# Patient Record
Sex: Male | Born: 1960
Health system: Southern US, Community
[De-identification: ages and names within clinical notes are randomized; demographics above are authoritative.]

## PROBLEM LIST (undated history)

## (undated) DIAGNOSIS — E785 Hyperlipidemia, unspecified: Secondary | ICD-10-CM

## (undated) DIAGNOSIS — E079 Disorder of thyroid, unspecified: Secondary | ICD-10-CM

## (undated) DIAGNOSIS — I10 Essential (primary) hypertension: Secondary | ICD-10-CM

## (undated) HISTORY — DX: Hyperlipidemia, unspecified: E78.5

## (undated) HISTORY — DX: Essential (primary) hypertension: I10

## (undated) HISTORY — DX: Disorder of thyroid, unspecified: E07.9

---

## 2015-04-21 ENCOUNTER — Other Ambulatory Visit: Payer: Self-pay | Admitting: Internal Medicine

## 2015-04-21 ENCOUNTER — Ambulatory Visit
Admission: RE | Admit: 2015-04-21 | Discharge: 2015-04-21 | Disposition: A | Discharge status: Home / Self Care | Payer: 59 | Source: Ambulatory Visit | Attending: Internal Medicine | Admitting: Internal Medicine

## 2015-04-21 DIAGNOSIS — R05 Cough: Secondary | ICD-10-CM

## 2015-04-21 DIAGNOSIS — R059 Cough, unspecified: Secondary | ICD-10-CM

## 2016-06-11 ENCOUNTER — Telehealth: Payer: Self-pay | Admitting: Oncology

## 2016-06-11 ENCOUNTER — Encounter: Payer: Self-pay | Admitting: Oncology

## 2016-06-11 NOTE — Telephone Encounter (Signed)
Appt scheduled w/Shadad for 11/15 at 2pm. Pt agreed to appt date and time. Demographics verified. Letter mailed and directions mailed to the patient.

## 2016-07-10 ENCOUNTER — Ambulatory Visit (HOSPITAL_BASED_OUTPATIENT_CLINIC_OR_DEPARTMENT_OTHER): Payer: 59

## 2016-07-10 ENCOUNTER — Ambulatory Visit (HOSPITAL_BASED_OUTPATIENT_CLINIC_OR_DEPARTMENT_OTHER): Payer: 59 | Admitting: Oncology

## 2016-07-10 ENCOUNTER — Telehealth: Payer: Self-pay | Admitting: Oncology

## 2016-07-10 VITALS — BP 159/82 | HR 84 | Temp 98.1°F | Resp 18 | Ht 68.0 in | Wt 142.9 lb

## 2016-07-10 DIAGNOSIS — D72829 Elevated white blood cell count, unspecified: Secondary | ICD-10-CM

## 2016-07-10 DIAGNOSIS — D729 Disorder of white blood cells, unspecified: Secondary | ICD-10-CM

## 2016-07-10 DIAGNOSIS — G2581 Restless legs syndrome: Secondary | ICD-10-CM | POA: Diagnosis not present

## 2016-07-10 DIAGNOSIS — Z720 Tobacco use: Secondary | ICD-10-CM

## 2016-07-10 LAB — COMPREHENSIVE METABOLIC PANEL
ALBUMIN: 3.9 g/dL (ref 3.5–5.0)
ALK PHOS: 143 U/L (ref 40–150)
ALT: 34 U/L (ref 0–55)
ANION GAP: 10 meq/L (ref 3–11)
AST: 28 U/L (ref 5–34)
BILIRUBIN TOTAL: 0.32 mg/dL (ref 0.20–1.20)
BUN: 21 mg/dL (ref 7.0–26.0)
CALCIUM: 9.7 mg/dL (ref 8.4–10.4)
CHLORIDE: 104 meq/L (ref 98–109)
CO2: 26 mEq/L (ref 22–29)
CREATININE: 0.8 mg/dL (ref 0.7–1.3)
EGFR: >90 mL/min/{1.73_m2} (ref >90)
Glucose: 83 mg/dl (ref 70–140)
Potassium: 4.3 mEq/L (ref 3.5–5.1)
Sodium: 139 mEq/L (ref 136–145)
TOTAL PROTEIN: 7.8 g/dL (ref 6.4–8.3)

## 2016-07-10 LAB — CBC WITH DIFFERENTIAL/PLATELET
BASO%: 0.6 % (ref 0.0–2.0)
Basophils Absolute: 0.1 10*3/uL (ref 0.0–0.1)
EOS ABS: 0.5 10*3/uL (ref 0.0–0.5)
EOS%: 4.4 % (ref 0.0–7.0)
HCT: 40.5 % (ref 38.4–49.9)
HEMOGLOBIN: 13.3 g/dL (ref 13.0–17.1)
LYMPH%: 32.4 % (ref 14.0–49.0)
MCH: 29.9 pg (ref 27.2–33.4)
MCHC: 32.8 g/dL (ref 32.0–36.0)
MCV: 91 fL (ref 79.3–98.0)
MONO#: 1 10*3/uL — AB (ref 0.1–0.9)
MONO%: 9 % (ref 0.0–14.0)
NEUT%: 53.6 % (ref 39.0–75.0)
NEUTROS ABS: 5.7 10*3/uL (ref 1.5–6.5)
PLATELETS: 243 10*3/uL (ref 140–400)
RBC: 4.45 10*6/uL (ref 4.20–5.82)
RDW: 13.3 % (ref 11.0–14.6)
WBC: 10.7 10*3/uL — AB (ref 4.0–10.3)
lymph#: 3.5 10*3/uL — ABNORMAL HIGH (ref 0.9–3.3)

## 2016-07-10 LAB — CHCC SMEAR

## 2016-07-10 NOTE — Progress Notes (Signed)
Reason for Referral:   HPI: 55 year old gentleman with past medical history significant for restless leg syndrome and hypothyroidism referred to me for evaluation of leukocytosis. He reports he has been dealing with a severe case of restless leg syndrome for over 10 years. He has been prescribed multiple medications in the past including narcotic analgesia and for the last 4 years he has been on methadone as well as Ritalin to help combat the side effects associated with methadone. He has been on Neurontin in the past but have been discontinued. He was also recently diagnosed with Bell's palsy which has resolved at this time. He was noted to have a leukocytosis on a CBC in October 2017. His white cell count was 15,000 with a hemoglobin of 12.4. His platelet count was normal at 314. His differential was normal with neutrophils, lymphocytes, monocytes and eosinophils are within normal range. His previous CBC in 2016 showed a white cell count of 13,000. His hemoglobin was 4.6. His otherwise CBC was normal. His CBC from August 2015 showed white cell count of 11.9 and normal hemoglobin, hematocrit, platelets and differential.  He is asymptomatic from this findings without any constitutional symptoms. He denied any fevers, chills or sweats or excessive fatigue. He does have history of heavy nicotine use used to be smoking traditionally and currently using electronic cigarettes and is highly and nicotine. Not any recent recurrent infections. He is up-to-date on 2 age-appropriate cancer screening. He did have a chest x-ray in August 2016 which was normal.  He does not report any headaches, blurry vision, syncope or seizures. He does not report any fevers, chills or sweats. He is not reporting any chest pain, palpitation, orthopnea or leg edema. He does not report any cough, wheezing or hemoptysis. He does not report any nausea, vomiting, abdominal pain or early satiety. He does not report any frequency urgency or  hesitancy. He does not report any skeletal complaints of arthralgias or myalgias. He does report chronic Pain related to his restless leg syndrome.    His past medical history significant for hypothyroidism, chronic pain syndrome related to restless leg. He denied any history of hypertension, diabetes or coronary disease. He denied any surgeries.   Current Outpatient Prescriptions:  .  Cholecalciferol (VITAMIN D-3) 1000 units CAPS, Take 1 capsule by mouth daily., Disp: , Rfl:  .  Coenzyme Q10 (COQ10) 100 MG CAPS, Take 1 capsule by mouth daily., Disp: , Rfl:  .  levothyroxine (SYNTHROID, LEVOTHROID) 88 MCG tablet, Take 1 tablet by mouth daily., Disp: , Rfl:  .  methadone (DOLOPHINE) 10 MG tablet, Take 30 mg by mouth 2 (two) times daily., Disp: , Rfl:  .  methylphenidate (RITALIN) 10 MG tablet, Take 1 tablet by mouth daily., Disp: , Rfl:  .  rOPINIRole (REQUIP) 3 MG tablet, Take 1 tablet by mouth daily., Disp: , Rfl:  .  traZODone (DESYREL) 50 MG tablet, Take 1 tablet by mouth at bedtime as needed., Disp: , Rfl:    Allergies  Allergen Reactions  . Aspirin Rash    Patient stated,"if I take aspirin in large doses I will get a rash."  :  No family history Of any malignancies or blood disorders.   Social History   Social History  . Marital status: Married    Spouse name: N/A  . Number of children: N/A  . Years of education: N/A   Occupational History  . Not on file.   Social History Main Topics  . Smoking status: Not on  file  . Smokeless tobacco: Not on file  . Alcohol use Not on file  . Drug use: Unknown  . Sexual activity: Not on file   Other Topics Concern  . Not on file   Social History Narrative  . No narrative on file  :  Pertinent items are noted in HPI.  Exam: Blood pressure (!) 159/82, pulse 84, temperature 98.1 F (36.7 C), temperature source Oral, resp. rate 18, height 5' 8" (1.727 m), weight 142 lb 14.4 oz (64.8 kg), SpO2 99 %.  ECOG 0  General appearance:  alert and cooperative appeared without distress. Head: Normocephalic, without obvious abnormality Throat: lips, mucosa, and tongue normal. No oral ulcers or thrush. Neck: no adenopathy Back: negative Chest wall: no tenderness Cardio: regular rate and rhythm, S1, S2 normal, no murmur, click, rub or gallop GI: soft, non-tender; bowel sounds normal; no masses,  no organomegaly Extremities: No edema noted. Neurological examination: No deficits in the motor, sensory are deep tendon reflexes.      Assessment and Plan:   54-year-old gentleman with the following issues:  1. Leukocytosis with normal differential. These findings date back to 2015 at least with a white cell count have ranged between 12-15,000. He has a normal differential and for the most part normal CBC. The differential diagnosis was discussed today with the patient.   Reactive leukocytosis is the most likely etiology in the setting. This would be due to his chronic nicotine consumption, seasonal allergy and possible medication related to Ritalin. Primary hematological condition are considered less likely but needs to be ruled out. Myeloproliferative disorder such as chronic myelogenous leukemia, myelofibrosis among others are consideration. I see no signs or symptoms to suggest acute leukemia at this time.  For completeness, I will obtain BCR-ABL as well as JAK2 mutation to rule out myeloproliferative disorders. If these tests are unremarkable, I do not see any need for further testing or a bone marrow biopsy.  Reactive leukocytosis related to an occult malignancy is extremely unlikely. He is up-to-date on his age-appropriate cancer screening including a colonoscopy as well as a chest x-ray that was negative in August 2016.  2. Follow-up: Will be depending on his workup and I will communicate the results of his laboratory testing which will be repeated today.  All his questions were answered today to his satisfaction. 

## 2016-07-10 NOTE — Telephone Encounter (Signed)
Labs added for today, per 07/10/16 los. No follow up appointed/labs per 07/10/16 los.  A copy of the AVS report was given to patient, per 07/10/16 los.

## 2016-07-17 ENCOUNTER — Telehealth: Payer: Self-pay

## 2016-07-17 NOTE — Telephone Encounter (Signed)
Pt called about his labs. The JAK2 and BCR-ABL are not resulted yet. Pt will call again on Monday.

## 2016-08-06 ENCOUNTER — Telehealth: Payer: Self-pay | Admitting: *Deleted

## 2016-08-06 NOTE — Telephone Encounter (Signed)
Please let him know all of his labs are normal.

## 2016-08-06 NOTE — Telephone Encounter (Signed)
Patient would like lab results from 07/10/16. Return number is [phone].

## 2016-08-07 ENCOUNTER — Telehealth: Payer: Self-pay | Admitting: *Deleted

## 2016-08-07 NOTE — Telephone Encounter (Signed)
As noted by Dr. Clelia CroftShadad, I informed patient that his labs were normal. Patient verbalized understanding.

## 2016-08-08 NOTE — Telephone Encounter (Signed)
Lm on patient identified answering machine. Labs are normal and offered to mail copy to patient's home or he may use my chart to review.

## 2018-12-09 MED FILL — traZODone HCL 50 MG TABS: 50 | 30 days supply | Qty: 30 | Fill #0

## 2018-12-09 MED FILL — METHYLPHENIDATE 10 MG TAB: 10 | 30 days supply | Qty: 60 | Fill #0

## 2018-12-09 MED FILL — METHADONE HCL 10 MG TABLET: 10 | 31 days supply | Qty: 217 | Fill #0

## 2018-12-09 MED FILL — LEVOTHYROXINE 88 MCG TABLET: 88 | 30 days supply | Qty: 30 | Fill #0

## 2019-01-05 MED FILL — LEVOTHYROXINE 88 MCG TABLET: 88 | 30 days supply | Qty: 30 | Fill #1

## 2019-01-05 MED FILL — traZODone HCL 50 MG TABS: 50 | 30 days supply | Qty: 30 | Fill #1

## 2019-01-05 MED FILL — METHYLPHENIDATE 10 MG TAB: 10 | 28 days supply | Qty: 56 | Fill #0

## 2019-01-06 MED FILL — METHADONE HCL 10 MG TABLET: 10 | 31 days supply | Qty: 217 | Fill #0

## 2019-02-03 MED FILL — traZODone HCL 50 MG TABS: 50 | 30 days supply | Qty: 30 | Fill #2

## 2019-02-03 MED FILL — METHYLPHENIDATE 10 MG TAB: 10 | 28 days supply | Qty: 56 | Fill #0

## 2019-02-03 MED FILL — LEVOTHYROXINE 88 MCG TABLET: 88 | 30 days supply | Qty: 30 | Fill #2

## 2019-02-04 MED FILL — METHADONE HCL 10 MG TABLET: 10 | 31 days supply | Qty: 217 | Fill #0

## 2019-03-02 MED FILL — METHYLPHENIDATE 10 MG TAB: 10 | 21 days supply | Qty: 42 | Fill #0

## 2019-03-05 MED FILL — traZODone HCL 50 MG TABS: 50 | 30 days supply | Qty: 30 | Fill #3

## 2019-03-05 MED FILL — LEVOTHYROXINE 88 MCG TABLET: 88 | 30 days supply | Qty: 30 | Fill #3

## 2019-03-05 MED FILL — METHADONE HCL 10 MG TABLET: 10 | 20 days supply | Qty: 140 | Fill #0

## 2019-03-23 MED FILL — METHYLPHENIDATE 10 MG TAB: 10 | 21 days supply | Qty: 42 | Fill #0

## 2019-03-23 MED FILL — METHADONE HCL 10 MG TABLET: 10 | 31 days supply | Qty: 217 | Fill #0

## 2019-03-24 MED FILL — ROSUVASTATIN CALCIUM 10 MG: 10 | 30 days supply | Qty: 30 | Fill #0

## 2019-03-28 MED FILL — traZODone HCL 50 MG TABS: 50 | 30 days supply | Qty: 30 | Fill #0

## 2019-03-28 MED FILL — LEVOTHYROXINE 88 MCG TABLET: 88 | 30 days supply | Qty: 30 | Fill #0

## 2019-03-30 ENCOUNTER — Other Ambulatory Visit: Payer: Self-pay | Admitting: Internal Medicine

## 2019-03-30 ENCOUNTER — Ambulatory Visit
Admission: RE | Admit: 2019-03-30 | Discharge: 2019-03-30 | Disposition: A | Discharge status: Home / Self Care | Payer: 59 | Source: Ambulatory Visit | Attending: Internal Medicine | Admitting: Internal Medicine

## 2019-03-30 ENCOUNTER — Other Ambulatory Visit: Payer: Self-pay

## 2019-03-30 DIAGNOSIS — R05 Cough: Secondary | ICD-10-CM

## 2019-03-30 DIAGNOSIS — R059 Cough, unspecified: Secondary | ICD-10-CM

## 2019-04-12 MED FILL — traZODone HCL 50 MG TABS: 50 | 30 days supply | Qty: 30 | Fill #0

## 2019-04-12 MED FILL — LEVOTHYROXINE 88 MCG TABLET: 88 | 30 days supply | Qty: 30 | Fill #0

## 2019-04-19 MED FILL — METHADONE HCL 10 MG TABLET: 10 | 31 days supply | Qty: 217 | Fill #0

## 2019-04-19 MED FILL — METHYLPHENIDATE 10 MG TAB: 10 | 28 days supply | Qty: 56 | Fill #0

## 2020-01-02 IMAGING — CR CHEST - 2 VIEW
2 series · 2 of 2 positions shown · non-contrast
Comparison: 04/21/2015

CLINICAL DATA: Cough

EXAM:
CHEST - 2 VIEW

[w chest pa]
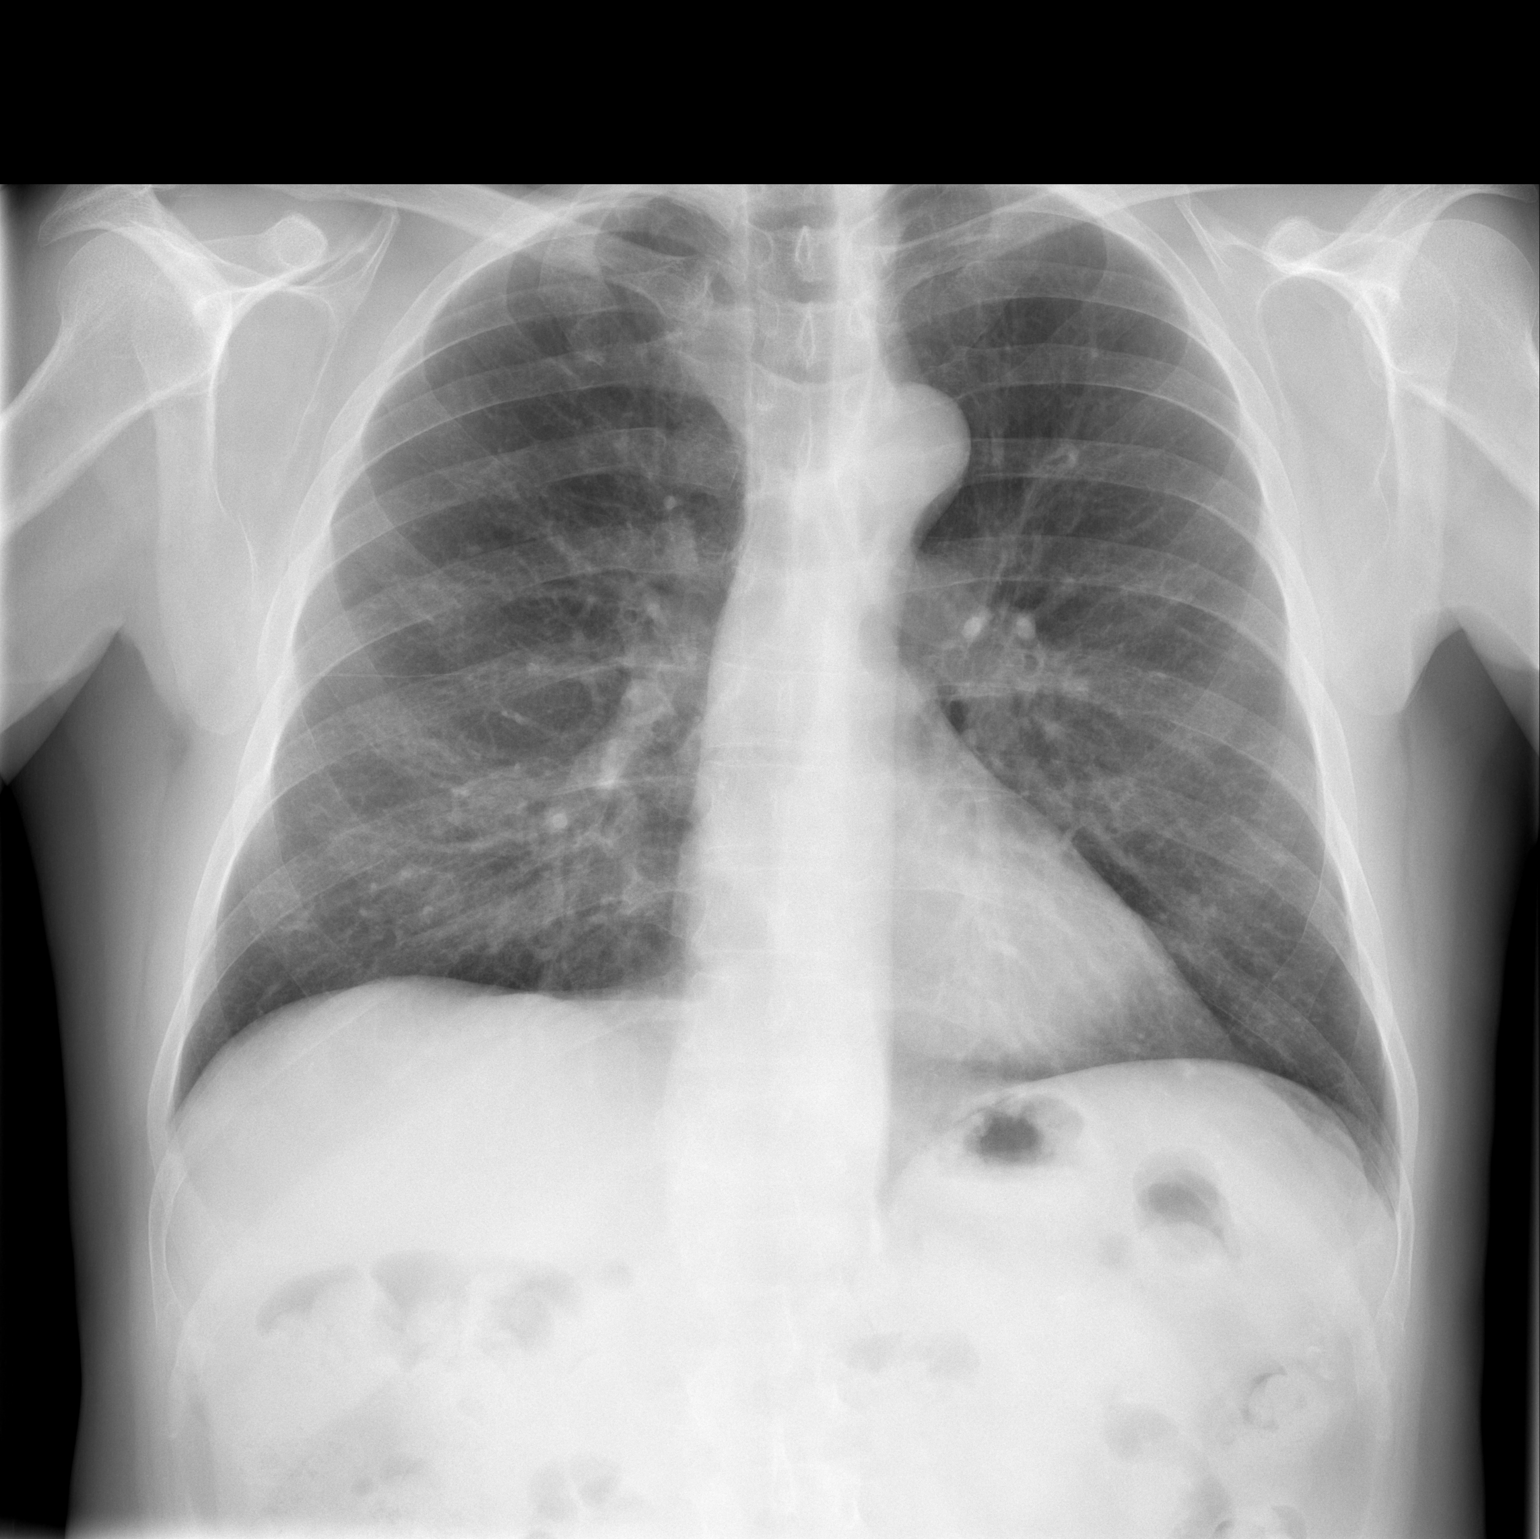

[w chest lat]
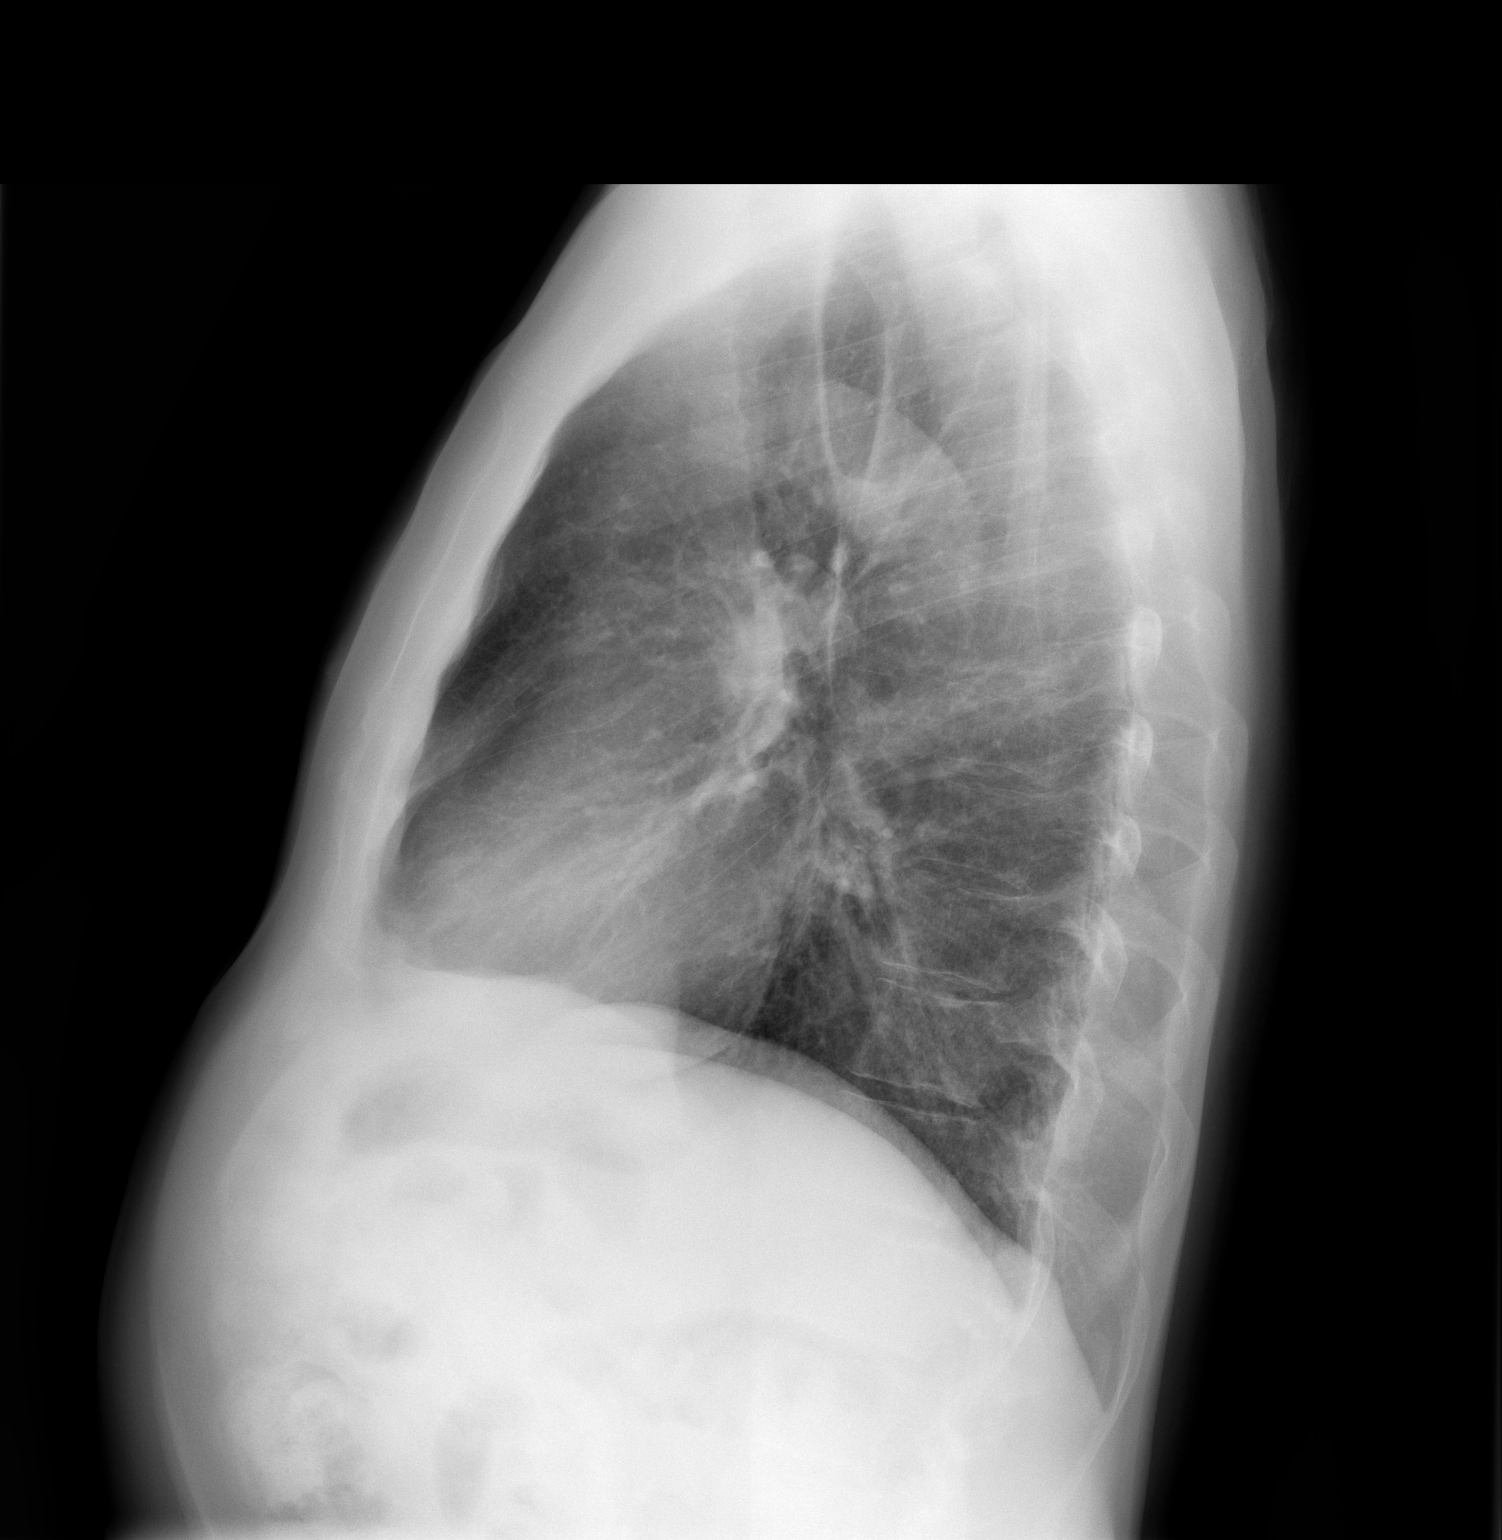

[2 of 2 positions shown; findings below may reference images not displayed]

FINDINGS: The heart size and mediastinal contours are within normal limits.
Both lungs are clear. The visualized skeletal structures are
unremarkable.
IMPRESSION: No active cardiopulmonary disease.

## 2020-03-22 ENCOUNTER — Other Ambulatory Visit (HOSPITAL_COMMUNITY): Payer: Self-pay | Admitting: Internal Medicine

## 2020-06-12 ENCOUNTER — Other Ambulatory Visit (HOSPITAL_COMMUNITY): Payer: Self-pay | Admitting: Internal Medicine

## 2020-07-10 ENCOUNTER — Other Ambulatory Visit (HOSPITAL_COMMUNITY): Payer: Self-pay | Admitting: Internal Medicine

## 2020-08-07 ENCOUNTER — Other Ambulatory Visit (HOSPITAL_COMMUNITY): Payer: Self-pay | Admitting: Internal Medicine

## 2020-09-04 ENCOUNTER — Other Ambulatory Visit (HOSPITAL_COMMUNITY): Payer: Self-pay | Admitting: Internal Medicine

## 2020-10-02 ENCOUNTER — Other Ambulatory Visit (HOSPITAL_COMMUNITY): Payer: Self-pay | Admitting: Internal Medicine

## 2020-10-30 ENCOUNTER — Other Ambulatory Visit (HOSPITAL_COMMUNITY): Payer: Self-pay | Admitting: Internal Medicine

## 2020-11-27 ENCOUNTER — Other Ambulatory Visit (HOSPITAL_COMMUNITY): Payer: Self-pay

## 2020-11-27 MED ORDER — METHYLPHENIDATE HCL 10 MG PO TABS
20.0000 mg | ORAL_TABLET | Freq: Every day | ORAL | 0 refills | Status: DC
  Filled 2020-11-27: qty 60, 30d supply, fill #0

## 2020-11-27 MED ORDER — METHADONE HCL 10 MG PO TABS
10.0000 mg | ORAL_TABLET | ORAL | 0 refills | Status: DC
  Filled 2020-11-27: qty 196, 28d supply, fill #0

## 2020-12-18 ENCOUNTER — Other Ambulatory Visit (HOSPITAL_COMMUNITY): Payer: Self-pay

## 2020-12-18 MED FILL — Levothyroxine Sodium Tab 88 MCG: ORAL | 90 days supply | Qty: 90 | Fill #0 | Status: AC

## 2020-12-18 MED FILL — Trazodone HCl Tab 50 MG: ORAL | 30 days supply | Qty: 60 | Fill #0 | Status: AC

## 2020-12-25 ENCOUNTER — Other Ambulatory Visit (HOSPITAL_COMMUNITY): Payer: Self-pay

## 2020-12-25 MED ORDER — METHYLPHENIDATE HCL 10 MG PO TABS
ORAL_TABLET | ORAL | 0 refills | Status: DC
  Filled 2020-12-25: qty 56, 28d supply, fill #0

## 2020-12-25 MED ORDER — METHADONE HCL 10 MG PO TABS
ORAL_TABLET | ORAL | 0 refills | Status: DC
  Filled 2020-12-25: qty 196, 28d supply, fill #0

## 2020-12-25 MED ORDER — METHYLPHENIDATE HCL 10 MG PO TABS
ORAL_TABLET | ORAL | 0 refills | Status: DC
  Filled 2021-01-15: qty 56, fill #0
  Filled 2021-01-19: qty 56, 28d supply, fill #0

## 2021-01-15 ENCOUNTER — Other Ambulatory Visit (HOSPITAL_COMMUNITY): Payer: Self-pay

## 2021-01-15 MED ORDER — METHADONE HCL 10 MG PO TABS
ORAL_TABLET | ORAL | 0 refills | Status: DC
  Filled 2021-01-15 – 2021-01-19 (×2): qty 196, 28d supply, fill #0

## 2021-01-15 MED ORDER — LISINOPRIL-HYDROCHLOROTHIAZIDE 20-12.5 MG PO TABS
1.0000 | ORAL_TABLET | Freq: Every day | ORAL | 0 refills | Status: DC
  Filled 2021-01-15: qty 30, 30d supply, fill #0

## 2021-01-19 ENCOUNTER — Other Ambulatory Visit (HOSPITAL_COMMUNITY): Payer: Self-pay

## 2021-02-01 ENCOUNTER — Other Ambulatory Visit (HOSPITAL_COMMUNITY): Payer: Self-pay

## 2021-02-01 MED ORDER — LISINOPRIL-HYDROCHLOROTHIAZIDE 20-12.5 MG PO TABS
1.0000 | ORAL_TABLET | Freq: Every day | ORAL | 0 refills | Status: DC
  Filled 2021-02-01 – 2021-02-27 (×2): qty 90, 90d supply, fill #0

## 2021-02-01 MED FILL — Trazodone HCl Tab 50 MG: ORAL | 30 days supply | Qty: 60 | Fill #1 | Status: AC

## 2021-02-12 ENCOUNTER — Other Ambulatory Visit (HOSPITAL_COMMUNITY): Payer: Self-pay

## 2021-02-12 MED ORDER — METHADONE HCL 10 MG PO TABS
ORAL_TABLET | ORAL | 0 refills | Status: DC
  Filled 2021-02-13: qty 196, 28d supply, fill #0

## 2021-02-13 ENCOUNTER — Other Ambulatory Visit (HOSPITAL_COMMUNITY): Payer: Self-pay

## 2021-02-27 ENCOUNTER — Other Ambulatory Visit (HOSPITAL_COMMUNITY): Payer: Self-pay

## 2021-02-28 ENCOUNTER — Other Ambulatory Visit (HOSPITAL_COMMUNITY): Payer: Self-pay

## 2021-03-09 ENCOUNTER — Other Ambulatory Visit (HOSPITAL_COMMUNITY): Payer: Self-pay

## 2021-03-12 ENCOUNTER — Other Ambulatory Visit (HOSPITAL_COMMUNITY): Payer: Self-pay

## 2021-03-12 MED ORDER — METHADONE HCL 10 MG PO TABS
ORAL_TABLET | ORAL | 0 refills | Status: DC
  Filled 2021-03-12: qty 91, 13d supply, fill #0

## 2021-03-13 ENCOUNTER — Other Ambulatory Visit (HOSPITAL_COMMUNITY): Payer: Self-pay

## 2021-03-22 ENCOUNTER — Other Ambulatory Visit (HOSPITAL_BASED_OUTPATIENT_CLINIC_OR_DEPARTMENT_OTHER): Payer: Self-pay

## 2021-03-29 ENCOUNTER — Other Ambulatory Visit (HOSPITAL_COMMUNITY): Payer: Self-pay

## 2021-03-29 MED ORDER — TRAZODONE HCL 50 MG PO TABS
100.0000 mg | ORAL_TABLET | Freq: Every day | ORAL | 0 refills | Status: DC
  Filled 2021-03-29: qty 60, 30d supply, fill #0

## 2021-03-29 MED ORDER — METHADONE HCL 10 MG PO TABS
ORAL_TABLET | ORAL | 0 refills | Status: DC
  Filled 2021-03-29: qty 140, 20d supply, fill #0

## 2021-04-04 ENCOUNTER — Other Ambulatory Visit (HOSPITAL_COMMUNITY): Payer: Self-pay

## 2021-04-17 ENCOUNTER — Other Ambulatory Visit (HOSPITAL_COMMUNITY): Payer: Self-pay

## 2021-04-17 MED ORDER — METHADONE HCL 10 MG PO TABS
ORAL_TABLET | ORAL | 0 refills | Status: DC
  Filled 2021-04-17: qty 196, 28d supply, fill #0

## 2021-04-17 MED ORDER — LISINOPRIL-HYDROCHLOROTHIAZIDE 20-12.5 MG PO TABS
1.0000 | ORAL_TABLET | Freq: Every day | ORAL | 4 refills | Status: DC
  Filled 2021-04-17: qty 90, 90d supply, fill #0

## 2021-04-17 MED ORDER — ROSUVASTATIN CALCIUM 10 MG PO TABS
10.0000 mg | ORAL_TABLET | Freq: Every day | ORAL | 4 refills | Status: DC
  Filled 2021-04-17: qty 90, 90d supply, fill #0
  Filled 2021-08-30: qty 90, 90d supply, fill #1
  Filled 2022-03-14: qty 90, 90d supply, fill #2

## 2021-04-17 MED ORDER — LEVOTHYROXINE SODIUM 88 MCG PO TABS
88.0000 ug | ORAL_TABLET | Freq: Every day | ORAL | 4 refills | Status: DC
  Filled 2021-04-17: qty 90, 90d supply, fill #0
  Filled 2021-08-02: qty 90, 90d supply, fill #1
  Filled 2021-11-26: qty 90, 90d supply, fill #2
  Filled 2022-02-18: qty 90, 90d supply, fill #3

## 2021-04-17 MED ORDER — TRAZODONE HCL 50 MG PO TABS
100.0000 mg | ORAL_TABLET | Freq: Every day | ORAL | 4 refills | Status: DC
  Filled 2021-04-17: qty 180, 90d supply, fill #0

## 2021-04-26 ENCOUNTER — Other Ambulatory Visit (HOSPITAL_COMMUNITY): Payer: Self-pay

## 2021-04-27 ENCOUNTER — Other Ambulatory Visit (HOSPITAL_COMMUNITY): Payer: Self-pay

## 2021-05-08 ENCOUNTER — Other Ambulatory Visit (HOSPITAL_COMMUNITY): Payer: Self-pay

## 2021-05-08 ENCOUNTER — Other Ambulatory Visit (HOSPITAL_BASED_OUTPATIENT_CLINIC_OR_DEPARTMENT_OTHER): Payer: Self-pay

## 2021-05-08 MED ORDER — TRAZODONE HCL 50 MG PO TABS
100.0000 mg | ORAL_TABLET | Freq: Every day | ORAL | 4 refills | Status: DC
  Filled 2021-05-08: qty 180, 90d supply, fill #0
  Filled 2021-08-30: qty 180, 90d supply, fill #1
  Filled 2021-12-19: qty 180, 90d supply, fill #2
  Filled 2022-03-14: qty 180, 90d supply, fill #3

## 2021-05-14 ENCOUNTER — Other Ambulatory Visit (HOSPITAL_COMMUNITY): Payer: Self-pay

## 2021-05-14 MED ORDER — METHADONE HCL 10 MG PO TABS
ORAL_TABLET | ORAL | 0 refills | Status: DC
  Filled 2021-05-14: qty 196, 28d supply, fill #0

## 2021-06-11 ENCOUNTER — Other Ambulatory Visit (HOSPITAL_COMMUNITY): Payer: Self-pay

## 2021-06-11 MED ORDER — METHADONE HCL 10 MG PO TABS
ORAL_TABLET | ORAL | 0 refills | Status: DC
  Filled 2021-06-11: qty 196, 28d supply, fill #0

## 2021-06-12 ENCOUNTER — Other Ambulatory Visit (HOSPITAL_COMMUNITY): Payer: Self-pay

## 2021-06-29 ENCOUNTER — Other Ambulatory Visit (HOSPITAL_COMMUNITY): Payer: Self-pay

## 2021-07-09 ENCOUNTER — Other Ambulatory Visit (HOSPITAL_COMMUNITY): Payer: Self-pay

## 2021-07-09 MED ORDER — METHYLPHENIDATE HCL 10 MG PO TABS
20.0000 mg | ORAL_TABLET | Freq: Every morning | ORAL | 0 refills | Status: DC
  Filled 2021-07-09: qty 56, 28d supply, fill #0

## 2021-07-09 MED ORDER — METHADONE HCL 10 MG PO TABS
ORAL_TABLET | Freq: Every day | ORAL | 0 refills | Status: DC
  Filled 2021-07-09 (×3): qty 196, 28d supply, fill #0
  Filled 2021-07-09: qty 7, 1d supply, fill #0
  Filled 2021-07-10: qty 181, 25d supply, fill #1
  Filled 2021-07-10: qty 8, 2d supply, fill #1
  Filled 2021-07-10: qty 181, 25d supply, fill #1

## 2021-07-10 ENCOUNTER — Other Ambulatory Visit (HOSPITAL_COMMUNITY): Payer: Self-pay

## 2021-08-02 ENCOUNTER — Other Ambulatory Visit (HOSPITAL_COMMUNITY): Payer: Self-pay

## 2021-08-06 ENCOUNTER — Other Ambulatory Visit (HOSPITAL_COMMUNITY): Payer: Self-pay

## 2021-08-06 MED ORDER — METHYLPHENIDATE HCL 10 MG PO TABS
20.0000 mg | ORAL_TABLET | Freq: Every morning | ORAL | 0 refills | Status: DC
  Filled 2021-08-06: qty 56, 28d supply, fill #0

## 2021-08-06 MED ORDER — METHADONE HCL 10 MG PO TABS
ORAL_TABLET | ORAL | 0 refills | Status: DC
  Filled 2021-08-06: qty 21, 3d supply, fill #0
  Filled 2021-08-06: qty 175, 25d supply, fill #0

## 2021-08-07 ENCOUNTER — Other Ambulatory Visit (HOSPITAL_COMMUNITY): Payer: Self-pay

## 2021-08-30 ENCOUNTER — Other Ambulatory Visit (HOSPITAL_COMMUNITY): Payer: Self-pay

## 2021-08-31 ENCOUNTER — Other Ambulatory Visit (HOSPITAL_COMMUNITY): Payer: Self-pay

## 2021-09-03 ENCOUNTER — Other Ambulatory Visit (HOSPITAL_COMMUNITY): Payer: Self-pay

## 2021-09-03 MED ORDER — METHYLPHENIDATE HCL 10 MG PO TABS
20.0000 mg | ORAL_TABLET | Freq: Every morning | ORAL | 0 refills | Status: DC
Start: 2021-09-03 — End: 2021-10-01
  Filled 2021-09-03: qty 56, 28d supply, fill #0

## 2021-09-03 MED ORDER — METHADONE HCL 10 MG PO TABS
ORAL_TABLET | ORAL | 0 refills | Status: DC
Start: 2021-09-03 — End: 2022-05-24
  Filled 2021-09-03 (×2): qty 82, 12d supply, fill #1
  Filled 2021-09-03: qty 114, 16d supply, fill #0

## 2021-09-03 MED ORDER — LOSARTAN POTASSIUM 50 MG PO TABS
50.0000 mg | ORAL_TABLET | Freq: Every day | ORAL | 2 refills | Status: DC
  Filled 2021-09-03: qty 90, 90d supply, fill #0

## 2021-09-04 ENCOUNTER — Other Ambulatory Visit (HOSPITAL_COMMUNITY): Payer: Self-pay

## 2021-09-05 ENCOUNTER — Other Ambulatory Visit (HOSPITAL_COMMUNITY): Payer: Self-pay

## 2021-09-07 ENCOUNTER — Other Ambulatory Visit (HOSPITAL_COMMUNITY): Payer: Self-pay

## 2021-09-12 ENCOUNTER — Other Ambulatory Visit (HOSPITAL_COMMUNITY): Payer: Self-pay

## 2021-09-14 ENCOUNTER — Other Ambulatory Visit (HOSPITAL_COMMUNITY): Payer: Self-pay

## 2021-10-01 ENCOUNTER — Other Ambulatory Visit (HOSPITAL_COMMUNITY): Payer: Self-pay

## 2021-10-01 MED ORDER — METHADONE HCL 10 MG PO TABS
30.0000 mg | ORAL_TABLET | Freq: Two times a day (BID) | ORAL | 0 refills | Status: DC
Start: 2021-10-01 — End: 2022-05-24
  Filled 2021-10-01: qty 196, 28d supply, fill #0

## 2021-10-01 MED ORDER — METHYLPHENIDATE HCL 10 MG PO TABS
20.0000 mg | ORAL_TABLET | Freq: Every morning | ORAL | 0 refills | Status: DC
Start: 2021-10-01 — End: 2021-10-29
  Filled 2021-10-01: qty 56, 28d supply, fill #0

## 2021-10-02 ENCOUNTER — Other Ambulatory Visit (HOSPITAL_COMMUNITY): Payer: Self-pay

## 2021-10-29 ENCOUNTER — Other Ambulatory Visit (HOSPITAL_COMMUNITY): Payer: Self-pay

## 2021-10-29 MED ORDER — METHYLPHENIDATE HCL 10 MG PO TABS
20.0000 mg | ORAL_TABLET | Freq: Every morning | ORAL | 0 refills | Status: DC
  Filled 2021-10-29: qty 56, 28d supply, fill #0

## 2021-10-29 MED ORDER — METHADONE HCL 10 MG PO TABS
ORAL_TABLET | ORAL | 0 refills | Status: DC
Start: 2021-10-29 — End: 2022-05-24
  Filled 2021-10-29: qty 196, 28d supply, fill #0

## 2021-11-26 ENCOUNTER — Other Ambulatory Visit (HOSPITAL_COMMUNITY): Payer: Self-pay

## 2021-11-26 MED ORDER — METHYLPHENIDATE HCL 10 MG PO TABS
20.0000 mg | ORAL_TABLET | Freq: Every morning | ORAL | 0 refills | Status: DC
  Filled 2021-11-26 – 2022-02-18 (×2): qty 56, 28d supply, fill #0

## 2021-11-26 MED ORDER — METHADONE HCL 10 MG PO TABS
ORAL_TABLET | ORAL | 0 refills | Status: DC
  Filled 2021-11-26: qty 196, 28d supply, fill #0

## 2021-12-19 ENCOUNTER — Other Ambulatory Visit (HOSPITAL_COMMUNITY): Payer: Self-pay

## 2021-12-24 ENCOUNTER — Other Ambulatory Visit (HOSPITAL_COMMUNITY): Payer: Self-pay

## 2021-12-24 MED ORDER — METHADONE HCL 10 MG PO TABS
ORAL_TABLET | ORAL | 0 refills | Status: DC
  Filled 2021-12-24: qty 196, 28d supply, fill #0

## 2021-12-24 MED ORDER — METHYLPHENIDATE HCL 10 MG PO TABS
20.0000 mg | ORAL_TABLET | Freq: Every morning | ORAL | 0 refills | Status: DC
  Filled 2021-12-24: qty 56, 28d supply, fill #0

## 2021-12-24 MED ORDER — LOSARTAN POTASSIUM 100 MG PO TABS
100.0000 mg | ORAL_TABLET | Freq: Every day | ORAL | 1 refills | Status: DC
Start: 2021-12-24 — End: 2022-05-24
  Filled 2022-02-18: qty 90, 90d supply, fill #0

## 2022-01-22 ENCOUNTER — Other Ambulatory Visit (HOSPITAL_COMMUNITY): Payer: Self-pay

## 2022-01-22 MED ORDER — METHADONE HCL 10 MG PO TABS
ORAL_TABLET | ORAL | 0 refills | Status: DC
  Filled 2022-01-22: qty 196, 28d supply, fill #0

## 2022-02-18 ENCOUNTER — Other Ambulatory Visit (HOSPITAL_COMMUNITY): Payer: Self-pay

## 2022-02-18 MED ORDER — HYDROCHLOROTHIAZIDE 12.5 MG PO CAPS
12.5000 mg | ORAL_CAPSULE | Freq: Every day | ORAL | 0 refills | Status: DC
  Filled 2022-02-18: qty 90, 90d supply, fill #0

## 2022-02-18 MED ORDER — METHADONE HCL 10 MG PO TABS
ORAL_TABLET | ORAL | 0 refills | Status: DC
Start: 2022-02-18 — End: 2022-03-18
  Filled 2022-02-18: qty 196, 28d supply, fill #0

## 2022-02-18 MED ORDER — METHYLPHENIDATE HCL 10 MG PO TABS
20.0000 mg | ORAL_TABLET | Freq: Every morning | ORAL | 0 refills | Status: DC
  Filled 2022-02-18: qty 56, 28d supply, fill #0

## 2022-03-14 ENCOUNTER — Other Ambulatory Visit (HOSPITAL_COMMUNITY): Payer: Self-pay

## 2022-03-18 ENCOUNTER — Other Ambulatory Visit (HOSPITAL_COMMUNITY): Payer: Self-pay

## 2022-03-18 MED ORDER — METHYLPHENIDATE HCL 10 MG PO TABS
20.0000 mg | ORAL_TABLET | Freq: Every morning | ORAL | 0 refills | Status: AC
  Filled 2022-03-18: qty 56, 28d supply, fill #0

## 2022-03-18 MED ORDER — METHADONE HCL 10 MG PO TABS
ORAL_TABLET | ORAL | 0 refills | Status: DC
  Filled 2022-03-18: qty 175, 25d supply, fill #0
  Filled 2022-03-18: qty 21, 3d supply, fill #0

## 2022-03-19 ENCOUNTER — Other Ambulatory Visit (HOSPITAL_COMMUNITY): Payer: Self-pay

## 2022-04-17 ENCOUNTER — Other Ambulatory Visit (HOSPITAL_COMMUNITY): Payer: Self-pay

## 2022-04-17 MED ORDER — METHADONE HCL 10 MG PO TABS
ORAL_TABLET | ORAL | 0 refills | Status: DC
  Filled 2022-04-17: qty 196, 28d supply, fill #0

## 2022-05-09 LAB — LAB REPORT - SCANNED
A1c: 5.3
EGFR: 83

## 2022-05-13 ENCOUNTER — Other Ambulatory Visit (HOSPITAL_COMMUNITY): Payer: Self-pay

## 2022-05-13 MED ORDER — METHADONE HCL 10 MG PO TABS
ORAL_TABLET | ORAL | 0 refills | Status: DC
  Filled 2022-05-13: qty 196, 28d supply, fill #0

## 2022-05-14 ENCOUNTER — Telehealth: Payer: Self-pay | Admitting: Oncology

## 2022-05-14 NOTE — Telephone Encounter (Signed)
Scheduled appt per 9/19 referral. Pt is aware of appt date and time. Pt is aware to arrive 15 mins prior to appt time and to bring and updated insurance card. Pt is aware of appt location.   

## 2022-05-15 ENCOUNTER — Other Ambulatory Visit (HOSPITAL_COMMUNITY): Payer: Self-pay

## 2022-05-15 ENCOUNTER — Other Ambulatory Visit: Payer: Self-pay | Admitting: Internal Medicine

## 2022-05-15 ENCOUNTER — Ambulatory Visit
Admission: RE | Admit: 2022-05-15 | Discharge: 2022-05-15 | Disposition: A | Discharge status: Home / Self Care | Payer: BC Managed Care – PPO | Source: Ambulatory Visit | Attending: Internal Medicine | Admitting: Internal Medicine

## 2022-05-15 DIAGNOSIS — R079 Chest pain, unspecified: Secondary | ICD-10-CM

## 2022-05-15 MED ORDER — LEVOTHYROXINE SODIUM 88 MCG PO TABS
88.0000 ug | ORAL_TABLET | Freq: Every day | ORAL | 4 refills | Status: AC
  Filled 2022-06-10: qty 90, 90d supply, fill #0
  Filled 2022-09-30: qty 90, 90d supply, fill #1
  Filled 2023-01-21: qty 90, 90d supply, fill #2

## 2022-05-15 MED ORDER — LOSARTAN POTASSIUM 100 MG PO TABS
100.0000 mg | ORAL_TABLET | Freq: Every day | ORAL | 4 refills | Status: DC
  Filled 2022-06-10: qty 90, 90d supply, fill #0
  Filled 2022-10-21: qty 90, 90d supply, fill #1
  Filled 2023-02-12 (×2): qty 90, 90d supply, fill #2

## 2022-05-15 MED ORDER — ROSUVASTATIN CALCIUM 10 MG PO TABS: 10.0000 mg | ORAL_TABLET | Freq: Every day | ORAL | 4 refills | Status: DC

## 2022-05-15 MED ORDER — HYDROCHLOROTHIAZIDE 12.5 MG PO CAPS
12.5000 mg | ORAL_CAPSULE | Freq: Every day | ORAL | 4 refills | Status: AC
  Filled 2022-06-10: qty 90, 90d supply, fill #0

## 2022-05-24 ENCOUNTER — Inpatient Hospital Stay: Payer: BC Managed Care – PPO | Attending: Oncology | Admitting: Oncology

## 2022-05-24 VITALS — BP 149/81 | HR 81 | Temp 97.2°F | Resp 16 | Wt 138.3 lb

## 2022-05-24 DIAGNOSIS — D72829 Elevated white blood cell count, unspecified: Secondary | ICD-10-CM | POA: Diagnosis not present

## 2022-05-24 NOTE — Progress Notes (Signed)
Reason for the request:   Leukocytosis  HPI: I was asked by Dr. Mancel Bale to evaluate Roy Espinoza for leukocytosis.  He is a 61 year old man significant for hypothyroidism, chronic pain and restless leg syndrome who was noted to have leukocytosis dating back to 2017.  He had fluctuating white cell count up to 15 and has fluctuated down to 10.7 upon my first evaluation in 2017.  Since that time, he is white cell count has been as high as 17.5 on May 08, 2022.  His hemoglobin was 13.8 with a platelet count of 233.  His differential including neutrophil percentage, lymphocytes monocytes eosinophils and basophils are all within normal range.  Clinically, he reports complaints of excessive fatigue and tiredness in the last year.  He denies any fevers, chills or sweats.  He denies any excessive weight loss his appetite has suffered.  He denies smoking but does vape occasionally.  He continues to have issues with restless leg syndrome and hypothyroidism.  He does not report any headaches, blurry vision, syncope or seizures. Does not report any fevers, chills or sweats.  Does not report any cough, wheezing or hemoptysis.  Does not report any chest pain, palpitation, orthopnea or leg edema.  Does not report any nausea, vomiting or abdominal pain.  Does not report any constipation or diarrhea.  Does not report any skeletal complaints.    Does not report frequency, urgency or hematuria.  Does not report any skin rashes or lesions. Does not report any heat or cold intolerance.  Does not report any lymphadenopathy or petechiae.  Does not report any anxiety or depression.  Remaining review of systems is negative.      Current Outpatient Medications:    Cholecalciferol (VITAMIN D-3) 1000 units CAPS, Take 1 capsule by mouth daily., Disp: , Rfl:    Coenzyme Q10 (COQ10) 100 MG CAPS, Take 1 capsule by mouth daily., Disp: , Rfl:    Cyanocobalamin (B-12) 5000 MCG CAPS, Take 1 capsule by mouth daily., Disp: , Rfl:     DOXYLAMINE SUCCINATE, SLEEP, PO, Take 25 mg by mouth at bedtime as needed. OTC sleep aid, Disp: , Rfl:    gabapentin (NEURONTIN) 100 MG capsule, Take 300 mg by mouth 2 (two) times daily., Disp: , Rfl:    hydrochlorothiazide (MICROZIDE) 12.5 MG capsule, Take 1 capsule (12.5 mg total) by mouth daily for blood pressure, Disp: 90 capsule, Rfl: 4   levothyroxine (SYNTHROID) 88 MCG tablet, TAKE 1 TABLET BY MOUTH ONCE A DAY, Disp: 90 tablet, Rfl: 4   levothyroxine (SYNTHROID) 88 MCG tablet, Take 1 tablet (88 mcg total) by mouth daily for thyroid, Disp: 90 tablet, Rfl: 4   levothyroxine (SYNTHROID, LEVOTHROID) 88 MCG tablet, Take 1 tablet by mouth daily., Disp: , Rfl:    losartan (COZAAR) 100 MG tablet, Take 1 tablet (100 mg total) by mouth daily., Disp: 90 tablet, Rfl: 1   losartan (COZAAR) 100 MG tablet, Take 1 tablet (100 mg total) by mouth daily., Disp: 90 tablet, Rfl: 4   magnesium oxide (MAG-OX) 400 MG tablet, Take 400 mg by mouth daily., Disp: , Rfl:    methadone (DOLOPHINE) 10 MG tablet, Take 30 mg by mouth 2 (two) times daily., Disp: , Rfl:    methadone (DOLOPHINE) 10 MG tablet, Take three tablets in the morning and four tablets in the evening daily, Disp: 196 tablet, Rfl: 0   methadone (DOLOPHINE) 10 MG tablet, Take 3 tablets (30 mg) by mouth in the morning and 4 tablets (40 mg)  in the evening daily., Disp: 196 tablet, Rfl: 0   methadone (DOLOPHINE) 10 MG tablet, Take 3 tablets (30 mg total) by mouth every morning AND 4 tablets (40 mg total) every evening., Disp: 196 tablet, Rfl: 0   methadone (DOLOPHINE) 10 MG tablet, Take 3 tablets (30 mg total) by mouth every morning AND 4 tablets (40 mg total) every evening., Disp: 196 tablet, Rfl: 0   methadone (DOLOPHINE) 10 MG tablet, Take 3 (30mg  total) tablets by mouth in the morning and 4 (40mg  total) tablets in the evening., Disp: 196 tablet, Rfl: 0   methadone (DOLOPHINE) 10 MG tablet, Take 3 tablets (30 mg total) by mouth in the morning AND 4 tablets  (40 mg total) every evening., Disp: 196 tablet, Rfl: 0   methadone (DOLOPHINE) 10 MG tablet, Take three tablets in the morning and four tablets in the evening daily, Disp: 196 tablet, Rfl: 0   methadone (DOLOPHINE) 10 MG tablet, Take 3 tablets (30 mg total) by mouth every morning AND 4 tablets (40 mg total) every evening., Disp: 196 tablet, Rfl: 0   methylphenidate (RITALIN) 10 MG tablet, Take 1 tablet by mouth daily., Disp: , Rfl:    methylphenidate (RITALIN) 10 MG tablet, TAKE 2 TABLETS BY MOUTH IN THE AM, Disp: 56 tablet, Rfl: 0   methylphenidate (RITALIN) 10 MG tablet, TAKE 2 TABLETS BY MOUTH IN THE MORNING, Disp: 56 tablet, Rfl: 0   methylphenidate (RITALIN) 10 MG tablet, TAKE 2 TABLETS BY MOUTH IN THE MORNING, Disp: 56 tablet, Rfl: 0   methylphenidate (RITALIN) 10 MG tablet, TAKE TWO TABLETS BY MOUTH IN THE MORNING, Disp: 56 tablet, Rfl: 0   methylphenidate (RITALIN) 10 MG tablet, TAKE TWO TABLETS BY MOUTH IN THE MORNING, Disp: 56 tablet, Rfl: 0   methylphenidate (RITALIN) 10 MG tablet, TAKE TWO TABLETS BY MOUTH IN THE AM, Disp: 56 tablet, Rfl: 0   methylphenidate (RITALIN) 10 MG tablet, Take 2 tablets by mouth in the morning, Disp: 56 tablet, Rfl: 0   methylphenidate (RITALIN) 10 MG tablet, Take 2 tablets by mouth in the morning, Disp: 56 tablet, Rfl: 0   methylphenidate (RITALIN) 10 MG tablet, Take 2 tablets (20 mg total) by mouth in the morning., Disp: 56 tablet, Rfl: 0   methylphenidate (RITALIN) 10 MG tablet, Take 2 tablets (20 mg total) by mouth in the morning., Disp: 56 tablet, Rfl: 0   methylphenidate (RITALIN) 10 MG tablet, Take 2 tablets (20 mg total) by mouth in the morning., Disp: 56 tablet, Rfl: 0   methylphenidate (RITALIN) 10 MG tablet, Take 2 tablets (20 mg total) by mouth in the morning., Disp: 56 tablet, Rfl: 0   multivitamin (ONE-A-DAY MEN'S) TABS tablet, Take 1 tablet by mouth daily., Disp: , Rfl:    Omega-3 Fatty Acids (OMEGA-3 FISH OIL PO), Take 750 mg by mouth daily.,  Disp: , Rfl:    rOPINIRole (REQUIP) 3 MG tablet, Take 1 tablet by mouth daily., Disp: , Rfl:    rosuvastatin (CRESTOR) 10 MG tablet, TAKE 1 TABLET BY MOUTH ONCE A DAY, Disp: 90 tablet, Rfl: 4   rosuvastatin (CRESTOR) 10 MG tablet, Take 1 tablet (10 mg total) by mouth daily., Disp: 90 tablet, Rfl: 4   traZODone (DESYREL) 50 MG tablet, Take 1 tablet by mouth at bedtime as needed., Disp: , Rfl:    traZODone (DESYREL) 50 MG tablet, Take 2 tablets (100 mg total) by mouth at bedtime., Disp: 180 tablet, Rfl: 4:   Allergies  Allergen Reactions  Aspirin Rash    Patient stated,"if I take aspirin in large doses I will get a rash."  :  No family history on file.:   Social History   Socioeconomic History   Marital status: Married    Spouse name: Not on file   Number of children: Not on file   Years of education: Not on file   Highest education level: Not on file  Occupational History   Not on file  Tobacco Use   Smoking status: Not on file   Smokeless tobacco: Not on file  Substance and Sexual Activity   Alcohol use: Not on file   Drug use: Not on file   Sexual activity: Not on file  Other Topics Concern   Not on file  Social History Narrative   Not on file   Social Determinants of Health   Financial Resource Strain: Not on file  Food Insecurity: Not on file  Transportation Needs: Not on file  Physical Activity: Not on file  Stress: Not on file  Social Connections: Not on file  Intimate Partner Violence: Not on file  :  Pertinent items are noted in HPI.  Exam: Blood pressure (!) 149/81, pulse 81, temperature (!) 97.2 F (36.2 C), temperature source Temporal, resp. rate 16, weight 138 lb 4.8 oz (62.7 kg), SpO2 98 %. ECOG 0 General appearance: alert and cooperative appeared without distress. Head: atraumatic without any abnormalities. Eyes: conjunctivae/corneas clear. PERRL.  Sclera anicteric. Throat: lips, mucosa, and tongue normal; without oral thrush or ulcers. Resp:  clear to auscultation bilaterally without rhonchi, wheezes or dullness to percussion. Cardio: regular rate and rhythm, S1, S2 normal, no murmur, click, rub or gallop GI: soft, non-tender; bowel sounds normal; no masses,  no organomegaly Skin: Skin color, texture, turgor normal. No rashes or lesions Lymph nodes: Cervical, supraclavicular, and axillary nodes normal. Neurologic: Grossly normal without any motor, sensory or deep tendon reflexes. Musculoskeletal: No joint deformity or effusion.    DG Chest 2 View  Result Date: 05/17/2022 CLINICAL DATA:  Chest pain.  Elevated white blood cell count. EXAM: CHEST - 2 VIEW COMPARISON:  Chest radiograph 03/30/2019 FINDINGS: The heart size and mediastinal contours are within normal limits. Both lungs are clear. The visualized skeletal structures are unremarkable. IMPRESSION: No active cardiopulmonary disease. Electronically Signed   By: Lovey Newcomer M.D.   On: 05/17/2022 06:12    Assessment and Plan:    61 year old with:  1.  Leukocytosis noted back in 2017 with normal differential.  His white cell count has fluctuated close to normal range and as high as 17.  He had a CBC in 2014 that showed a white cell count of 14.6 as well.  His work-up in 2017 included a negative JAK2 mutation as well as BCR/ABL to rule out CML.   The differential diagnosis of these findings were discussed.  These findings are likely consistent with reactive leukocytosis.  I see no evidence to suggest myeloproliferative disorder given the fluctuating nature of his white cell count as well as his initial work-up in 2017 that was negative.  It is very likely that he will continue to have fluctuations in his white cell count for the remainder of his life.  His CBC otherwise showed no concern for hematological issues.  Undiagnosed malignancy causing reactive leukocytosis could be considered.  Although this is unlikely cannot be completely ruled out.  Recommendation for CT scan risks and  benefits of obtaining that was discussed.  He will consider that and  let me know in the near future.  2.  Follow-up: No follow-up is indicated unless abnormalities detected on future imaging studies.   45  minutes were dedicated to this visit. The time was spent on reviewing laboratory data, imaging studies, discussing differential diagnosis and answering questions regarding future plan.   A copy of this consult has been forwarded to the requesting physician.

## 2022-06-10 ENCOUNTER — Other Ambulatory Visit (HOSPITAL_COMMUNITY): Payer: Self-pay

## 2022-06-10 MED ORDER — METHADONE HCL 10 MG PO TABS
ORAL_TABLET | ORAL | 0 refills | Status: DC
  Filled 2022-06-10: qty 196, 28d supply, fill #0

## 2022-06-11 ENCOUNTER — Other Ambulatory Visit (HOSPITAL_COMMUNITY): Payer: Self-pay

## 2022-06-19 NOTE — Progress Notes (Deleted)
Cardiology Office Note:    Date:  06/19/2022   ID:  HAMPTON WIXOM, DOB 11/24/1960, MRN [id number]  PCP:  Lorene Dy, MD   [place] Providers Cardiologist:  None { Click to update primary MD,subspecialty MD or APP then REFRESH:1}    Referring MD: Lorene Dy, MD   No chief complaint on file. ***  History of Present Illness:    Roy Espinoza is a 61 y.o. male seen at the request of Dr Mancel Bale for evaluation of chest pain.   No past medical history on file.  *** The histories are not reviewed yet. Please review them in the "History" navigator section and refresh this [place].  Current Medications: No outpatient medications have been marked as taking for the 07/03/22 encounter (Appointment) with [place], Zea Kostka M, MD.     Allergies:   Aspirin   Social History   Socioeconomic History   Marital status: Married    Spouse name: Not on file   Number of children: Not on file   Years of education: Not on file   Highest education level: Not on file  Occupational History   Not on file  Tobacco Use   Smoking status: Not on file   Smokeless tobacco: Not on file  Substance and Sexual Activity   Alcohol use: Not on file   Drug use: Not on file   Sexual activity: Not on file  Other Topics Concern   Not on file  Social History Narrative   Not on file   Social Determinants of Health   Financial Resource Strain: Not on file  Food Insecurity: Not on file  Transportation Needs: Not on file  Physical Activity: Not on file  Stress: Not on file  Social Connections: Not on file     Family History: The patient's ***family history is not on file.  ROS:   Please see the history of present illness.    *** All other systems reviewed and are negative.  EKGs/Labs/Other Studies Reviewed:    The following studies were reviewed today: ***  EKG:  EKG is *** ordered today.  The ekg ordered today demonstrates ***  Recent Labs: No results found for  requested labs within last 365 days.  Recent Lipid Panel No results found for: "CHOL", "TRIG", "HDL", "CHOLHDL", "VLDL", "LDLCALC", "LDLDIRECT"   Risk Assessment/Calculations:   {Does this patient have ATRIAL FIBRILLATION?:[id number]}  No BP recorded.  {Refresh Note OR Click here to enter BP  :1}***         Physical Exam:    VS:  There were no vitals taken for this visit.    Wt Readings from Last 3 Encounters:  05/24/22 138 lb 4.8 oz (62.7 kg)  07/10/16 142 lb 14.4 oz (64.8 kg)     GEN: *** Well nourished, well developed in no acute distress HEENT: Normal NECK: No JVD; No carotid bruits LYMPHATICS: No lymphadenopathy CARDIAC: ***RRR, no murmurs, rubs, gallops RESPIRATORY:  Clear to auscultation without rales, wheezing or rhonchi  ABDOMEN: Soft, non-tender, non-distended MUSCULOSKELETAL:  No edema; No deformity  SKIN: Warm and dry NEUROLOGIC:  Alert and oriented x 3 PSYCHIATRIC:  Normal affect   ASSESSMENT:    No diagnosis found. PLAN:    In order of problems listed above:  ***      {Are you ordering a CV Procedure (e.g. stress test, cath, DCCV, TEE, etc)?   Press F2        :[id number]}    Medication Adjustments/Labs and Tests Ordered:  Current medicines are reviewed at length with the patient today.  Concerns regarding medicines are outlined above.  No orders of the defined types were placed in this encounter.  No orders of the defined types were placed in this encounter.   There are no Patient Instructions on file for this visit.   Signed, Jarmon Javid [place], MD  06/19/2022 8:31 AM    [facility]

## 2022-06-26 ENCOUNTER — Other Ambulatory Visit (HOSPITAL_COMMUNITY): Payer: Self-pay

## 2022-07-03 ENCOUNTER — Ambulatory Visit: Payer: BC Managed Care – PPO | Admitting: Cardiology

## 2022-07-08 ENCOUNTER — Other Ambulatory Visit (HOSPITAL_COMMUNITY): Payer: Self-pay

## 2022-07-08 MED ORDER — TRAZODONE HCL 50 MG PO TABS
100.0000 mg | ORAL_TABLET | Freq: Every day | ORAL | 3 refills | Status: AC
  Filled 2022-07-08: qty 180, 90d supply, fill #0
  Filled 2022-10-21: qty 180, 90d supply, fill #1
  Filled 2023-02-12 (×2): qty 180, 90d supply, fill #2

## 2022-07-08 MED ORDER — METHADONE HCL 10 MG PO TABS
ORAL_TABLET | ORAL | 0 refills | Status: DC
  Filled 2022-07-08: qty 196, 28d supply, fill #0

## 2022-07-23 NOTE — Progress Notes (Unsigned)
Cardiology Office Note:    Date:  07/25/2022   ID:  SEBRON MCMAHILL, DOB [birth year], MRN [id number]  PCP:  Burton Apley, MD   [facility] Providers Cardiologist:  None     Referring MD: Burton Apley, MD   Chief Complaint  Patient presents with   New Patient (Initial Visit)   Chest Pain    History of Present Illness:    Roy Espinoza is a 61 y.o. male seen at the request of Dr Su Hilt for evaluation of chest pain. He has a history of HTN, HLD and tobacco use. He reports over the past year he has a strange sensation in his chest. Typically occurs in the early morning. Not associated with activity. Feels a pressure building in his lower chest and it becomes quite uncomfortable. Will last 2-3 minutes. He gets relief by crouching on the floor bent over and taking deep breaths. May happen 2-3 times a day then goes away. Does not bother him at other times of day. Heart beat is constant with this. Feels like a bubble blowing up. He quit smoking 10 years ago but continue to Vape daily. Reports good control of cholesterol and HTN. ASA intolerance noted as a teenager- would have a rash if he took.   Past Medical History:  Diagnosis Date   Hyperlipidemia    Hypertension    Thyroid disease     History reviewed. No pertinent surgical history.  Current Medications: Current Meds  Medication Sig   Cholecalciferol (VITAMIN D-3) 1000 units CAPS Take 1 capsule by mouth daily.   Coenzyme Q10 (COQ10) 100 MG CAPS Take 1 capsule by mouth daily.   Cyanocobalamin (B-12) 5000 MCG CAPS Take 1 capsule by mouth daily.   DOXYLAMINE SUCCINATE, SLEEP, PO Take 25 mg by mouth at bedtime as needed. OTC sleep aid   hydrochlorothiazide (MICROZIDE) 12.5 MG capsule Take 1 capsule (12.5 mg total) by mouth daily for blood pressure   levothyroxine (SYNTHROID) 88 MCG tablet Take 1 tablet (88 mcg total) by mouth daily for thyroid   losartan (COZAAR) 100 MG tablet Take 1 tablet (100 mg total) by  mouth daily.   methadone (DOLOPHINE) 10 MG tablet Take 3 tablets (30 mg total) by mouth in the morning AND 4 tablets (40 mg total) every evening.   methylphenidate (RITALIN) 10 MG tablet Take 2 tablets (20 mg total) by mouth in the morning.   metoprolol tartrate (LOPRESSOR) 100 MG tablet Take 1 tablet (100 mg total) by mouth 2 hours before Coronary CT.   multivitamin (ONE-A-DAY MEN'S) TABS tablet Take 1 tablet by mouth daily.   Omega-3 Fatty Acids (OMEGA-3 FISH OIL PO) Take 750 mg by mouth daily.   traZODone (DESYREL) 50 MG tablet Take 2 tablets (100 mg total) by mouth at bedtime.   [DISCONTINUED] rosuvastatin (CRESTOR) 10 MG tablet Take 1 tablet (10 mg total) by mouth daily.     Allergies:   Aspirin   Social History   Socioeconomic History   Marital status: Married    Spouse name: Not on file   Number of children: 0   Years of education: Not on file   Highest education level: Not on file  Occupational History   Not on file  Tobacco Use   Smoking status: Former    Types: Cigarettes    Quit date: 07/09/2012    Years since quitting: 10.0   Smokeless tobacco: Never  Vaping Use   Vaping Use: Every day  Substance and Sexual Activity  Alcohol use: Not on file   Drug use: Not on file   Sexual activity: Not on file  Other Topics Concern   Not on file  Social History Narrative   Sales rep    Social Determinants of Health   Financial Resource Strain: Not on file  Food Insecurity: Not on file  Transportation Needs: Not on file  Physical Activity: Not on file  Stress: Not on file  Social Connections: Not on file     Family History: The patient's family history includes Arrhythmia in his sister; Colon cancer in his mother.  ROS:   Please see the history of present illness.     All other systems reviewed and are negative.  EKGs/Labs/Other Studies Reviewed:    The following studies were reviewed today: none  EKG:  EKG is  ordered today.  The ekg ordered today  demonstrates NSR rate 73. Incomplete RBBB. Normal. I have personally reviewed and interpreted this study.   Recent Labs: No results found for requested labs within last 365 days.  Recent Lipid Panel No results found for: "CHOL", "TRIG", "HDL", "CHOLHDL", "VLDL", "LDLCALC", "LDLDIRECT"   Risk Assessment/Calculations:                Physical Exam:    VS:  BP 116/70 (BP Location: Right Arm, Patient Position: Sitting, Cuff Size: Normal)   Pulse 73   Ht 5\' 8"  (1.727 m)   Wt 142 lb (64.4 kg)   BMI 21.59 kg/m     Wt Readings from Last 3 Encounters:  07/25/22 142 lb (64.4 kg)  05/24/22 138 lb 4.8 oz (62.7 kg)  07/10/16 142 lb 14.4 oz (64.8 kg)     GEN:  Well nourished, well developed in no acute distress HEENT: Normal NECK: No JVD; No carotid bruits LYMPHATICS: No lymphadenopathy CARDIAC: RRR, no murmurs, rubs, gallops RESPIRATORY:  Clear to auscultation without rales, wheezing or rhonchi  ABDOMEN: Soft, non-tender, non-distended MUSCULOSKELETAL:  No edema; No deformity  SKIN: Warm and dry NEUROLOGIC:  Alert and oriented x 3 PSYCHIATRIC:  Normal affect   ASSESSMENT:    1. Chest pain of uncertain etiology   2. Primary hypertension   3. Hypercholesterolemia   4. Tobacco abuse   5. Precordial pain    PLAN:    In order of problems listed above:  Chest pain with atypical features. Patient has CV risk factors of HTN, HLD and tobacco use. Recommend ischemic evaluation. We discussed modalities including stress testing versus coronary CTA. After discussion patient would like to proceed with CTA. Will check BMET.  HTN controlled. HLD on Crestor every other day. Request copy of labs Tobacco use/vaping. Counseled on importance of cessation to lower CV risk.            Medication Adjustments/Labs and Tests Ordered: Current medicines are reviewed at length with the patient today.  Concerns regarding medicines are outlined above.  Orders Placed This Encounter  Procedures    CT CORONARY MORPH W/CTA COR W/SCORE W/CA W/CM &/OR WO/CM   Basic metabolic panel   EKG 12-Lead   Meds ordered this encounter  Medications   rosuvastatin (CRESTOR) 10 MG tablet    Sig: Take 1 tablet (10 mg total) by mouth daily. Takes 3-4 days a week.    Dispense:  90 tablet    Refill:  4   metoprolol tartrate (LOPRESSOR) 100 MG tablet    Sig: Take 1 tablet (100 mg total) by mouth 2 hours before Coronary CT.  Dispense:  1 tablet    Refill:  0    Patient Instructions  Medication Instructions:  Continue same medications   Lab Work: Bmet today   Testing/Procedures: Coronary CT  will be scheduled after approved by insurance  Follow instructions below   Follow-Up: At [facility], you and your health needs are our priority.  As part of our continuing mission to provide you with exceptional heart care, we have created designated Provider Care Teams.  These Care Teams include your primary Cardiologist (physician) and Advanced Practice Providers (APPs -  Physician Assistants and Nurse Practitioners) who all work together to provide you with the care you need, when you need it.  We recommend signing up for the patient portal called "MyChart".  Sign up information is provided on this After Visit Summary.  MyChart is used to connect with patients for Virtual Visits (Telemedicine).  Patients are able to view lab/test results, encounter notes, upcoming appointments, etc.  Non-urgent messages can be sent to your provider as well.   To learn more about what you can do with MyChart, go to [email or web address].    Your next appointment:  To Be Determined    The format for your next appointment: Office    Provider:  Dr.Sole Lengacher    Your cardiac CT will be scheduled at one of the below locations:   [facility] [address] [place], [place] [address] [phone]  OR  [facility] [address] [place], [place] [address] [phone]  OR   [facility] [address] [place], [place] [address] [phone]  If scheduled at [facility], please arrive at the [facility] and Children's Entrance (Entrance C2) of [facility] 30 minutes prior to test start time. You can use the FREE valet parking offered at entrance C (encouraged to control the heart rate for the test)  Proceed to the [facility] Radiology Department (first floor) to check-in and test prep.  All radiology patients and guests should use entrance C2 at [facility], accessed from [facility], even though the hospital's physical address listed is [address]    If scheduled at [facility] or [facility], please arrive 15 mins early for check-in and test prep.   Please follow these instructions carefully (unless otherwise directed):  Hold all erectile dysfunction medications at least 3 days (72 hrs) prior to test. (Ie viagra, cialis, sildenafil, tadalafil, etc) We will administer nitroglycerin during this exam.   On the Night Before the Test: Be sure to Drink plenty of water. Do not consume any caffeinated/decaffeinated beverages or chocolate 12 hours prior to your test. Do not take any antihistamines 12 hours prior to your test.   On the Day of the Test: Drink plenty of water until 1 hour prior to the test. Do not eat any food 1 hour prior to test. You may take your regular medications prior to the test.  Take metoprolol 100 mg two hours prior to test. HOLD Hydrochlorothiazide morning of the test.          After the Test: Drink plenty of water. After receiving IV contrast, you may experience a mild flushed feeling. This is normal. On occasion, you may experience a mild rash up to 24 hours after the test. This is not dangerous. If this occurs, you can take Benadryl 25 mg and increase your fluid  intake. If you  experience trouble breathing, this can be serious. If it is severe call 911 IMMEDIATELY. If it is mild, please call our office. If you take any of these medications: Glipizide/Metformin, Avandament, Glucavance, please do not take 48 hours after completing test unless otherwise instructed.  We will call to schedule your test 2-4 weeks out understanding that some insurance companies will need an authorization prior to the service being performed.   For non-scheduling related questions, please contact the cardiac imaging nurse navigator should you have any questions/concerns: Rockwell Alexandria, Cardiac Imaging Nurse Navigator Larey Brick, Cardiac Imaging Nurse Navigator [facility] Direct Office Dial: [phone]   For scheduling needs, including cancellations and rescheduling, please call [place], [phone].  Important Information About Sugar         Signed, Vinaya Sancho [place], MD  07/25/2022 2:53 PM    [facility]

## 2022-07-25 ENCOUNTER — Other Ambulatory Visit (HOSPITAL_COMMUNITY): Payer: Self-pay

## 2022-07-25 ENCOUNTER — Ambulatory Visit: Payer: BC Managed Care – PPO | Attending: Cardiology | Admitting: Cardiology

## 2022-07-25 ENCOUNTER — Encounter: Payer: Self-pay | Admitting: Cardiology

## 2022-07-25 VITALS — BP 116/70 | HR 73 | Ht 68.0 in | Wt 142.0 lb

## 2022-07-25 DIAGNOSIS — R079 Chest pain, unspecified: Secondary | ICD-10-CM | POA: Diagnosis not present

## 2022-07-25 DIAGNOSIS — Z720 Tobacco use: Secondary | ICD-10-CM

## 2022-07-25 DIAGNOSIS — I10 Essential (primary) hypertension: Secondary | ICD-10-CM

## 2022-07-25 DIAGNOSIS — R072 Precordial pain: Secondary | ICD-10-CM

## 2022-07-25 DIAGNOSIS — E7800 Pure hypercholesterolemia, unspecified: Secondary | ICD-10-CM

## 2022-07-25 MED ORDER — METOPROLOL TARTRATE 100 MG PO TABS
100.0000 mg | ORAL_TABLET | ORAL | 0 refills | Status: AC
  Filled 2022-07-25: qty 1, 1d supply, fill #0

## 2022-07-25 MED ORDER — ROSUVASTATIN CALCIUM 10 MG PO TABS
10.0000 mg | ORAL_TABLET | Freq: Every day | ORAL | 4 refills | Status: AC
  Filled 2022-07-25: qty 90, 90d supply, fill #0
  Filled 2023-01-21: qty 90, 90d supply, fill #1
  Filled 2023-05-12: qty 90, 90d supply, fill #2

## 2022-07-25 NOTE — Patient Instructions (Addendum)
Medication Instructions:  Continue same medications   Lab Work: Bmet today   Testing/Procedures: Coronary CT  will be scheduled after approved by insurance  Follow instructions below   Follow-Up: At [facility], you and your health needs are our priority.  As part of our continuing mission to provide you with exceptional heart care, we have created designated Provider Care Teams.  These Care Teams include your primary Cardiologist (physician) and Advanced Practice Providers (APPs -  Physician Assistants and Nurse Practitioners) who all work together to provide you with the care you need, when you need it.  We recommend signing up for the patient portal called "MyChart".  Sign up information is provided on this After Visit Summary.  MyChart is used to connect with patients for Virtual Visits (Telemedicine).  Patients are able to view lab/test results, encounter notes, upcoming appointments, etc.  Non-urgent messages can be sent to your provider as well.   To learn more about what you can do with MyChart, go to [email or web address].    Your next appointment:  To Be Determined    The format for your next appointment: Office    Provider:  Dr.Jordan    Your cardiac CT will be scheduled at one of the below locations:   [facility] [address] [place], [place] [address] [phone]  OR  [facility] [address] [place], [place] [address] [phone]  OR   [facility] [address] [place], [place] [address] [phone]  If scheduled at [facility], please arrive at the [facility] and Children's Entrance (Entrance C2) of [facility] 30 minutes prior to test start time. You can use the FREE valet parking offered at entrance C (encouraged to control the heart rate for the test)  Proceed to the [facility] Radiology Department (first floor) to check-in and test  prep.  All radiology patients and guests should use entrance C2 at [facility], accessed from [facility], even though the hospital's physical address listed is [address].    If scheduled at [facility] or [facility], please arrive 15 mins early for check-in and test prep.   Please follow these instructions carefully (unless otherwise directed):  Hold all erectile dysfunction medications at least 3 days (72 hrs) prior to test. (Ie viagra, cialis, sildenafil, tadalafil, etc) We will administer nitroglycerin during this exam.   On the Night Before the Test: Be sure to Drink plenty of water. Do not consume any caffeinated/decaffeinated beverages or chocolate 12 hours prior to your test. Do not take any antihistamines 12 hours prior to your test.   On the Day of the Test: Drink plenty of water until 1 hour prior to the test. Do not eat any food 1 hour prior to test. You may take your regular medications prior to the test.  Take metoprolol 100 mg two hours prior to test. HOLD Hydrochlorothiazide morning of the test.          After the Test: Drink plenty of water. After receiving IV contrast, you may experience a mild flushed feeling. This is normal. On occasion, you may experience a mild rash up to 24 hours after the test. This is not dangerous. If this occurs, you can take Benadryl 25 mg and increase your fluid intake. If you experience trouble breathing, this can be serious. If it is severe call 911 IMMEDIATELY. If it is  mild, please call our office. If you take any of these medications: Glipizide/Metformin, Avandament, Glucavance, please do not take 48 hours after completing test unless otherwise instructed.  We will call to schedule your test 2-4 weeks out understanding that some insurance companies will need an authorization prior to the service being performed.   For non-scheduling related  questions, please contact the cardiac imaging nurse navigator should you have any questions/concerns: Roy Espinoza, Cardiac Imaging Nurse Navigator Roy Espinoza, Cardiac Imaging Nurse Navigator [place] [facility] Direct Office Dial: [phone]   For scheduling needs, including cancellations and rescheduling, please call Roy Espinoza, [phone].  Important Information About Sugar

## 2022-07-26 ENCOUNTER — Other Ambulatory Visit (HOSPITAL_COMMUNITY): Payer: Self-pay

## 2022-07-26 ENCOUNTER — Other Ambulatory Visit: Payer: Self-pay

## 2022-07-26 LAB — BASIC METABOLIC PANEL
BUN/Creatinine Ratio: 30 — ABNORMAL HIGH (ref 10–24)
BUN: 21 mg/dL (ref 8–27)
CO2: 25 mmol/L (ref 20–29)
Calcium: 9.6 mg/dL (ref 8.6–10.2)
Chloride: 106 mmol/L (ref 96–106)
Creatinine, Ser: 0.71 mg/dL — ABNORMAL LOW (ref 0.76–1.27)
Glucose: 88 mg/dL (ref 70–99)
Potassium: 4.3 mmol/L (ref 3.5–5.2)
Sodium: 144 mmol/L (ref 134–144)
eGFR: 104 mL/min/{1.73_m2} (ref >59)

## 2022-08-05 ENCOUNTER — Other Ambulatory Visit (HOSPITAL_COMMUNITY): Payer: Self-pay

## 2022-08-05 MED ORDER — METHADONE HCL 10 MG PO TABS
ORAL_TABLET | ORAL | 0 refills | Status: DC
  Filled 2022-08-05: qty 196, 28d supply, fill #0

## 2022-08-08 ENCOUNTER — Telehealth (HOSPITAL_COMMUNITY): Payer: Self-pay | Admitting: *Deleted

## 2022-08-08 NOTE — Telephone Encounter (Signed)
Attempted to call patient regarding upcoming cardiac CT appointment. °Left message on voicemail with name and callback number ° °Laurabeth Yip RN Navigator Cardiac Imaging °[facility] °[phone] Office °[phone] Cell ° °

## 2022-08-09 ENCOUNTER — Other Ambulatory Visit: Payer: Self-pay | Admitting: Cardiovascular Disease

## 2022-08-09 ENCOUNTER — Ambulatory Visit (HOSPITAL_COMMUNITY)
Admission: RE | Admit: 2022-08-09 | Discharge: 2022-08-09 | Disposition: A | Discharge status: Home / Self Care | Payer: BC Managed Care – PPO | Source: Ambulatory Visit | Attending: Cardiology | Admitting: Cardiology

## 2022-08-09 DIAGNOSIS — E7800 Pure hypercholesterolemia, unspecified: Secondary | ICD-10-CM | POA: Diagnosis present

## 2022-08-09 DIAGNOSIS — R072 Precordial pain: Secondary | ICD-10-CM

## 2022-08-09 DIAGNOSIS — R931 Abnormal findings on diagnostic imaging of heart and coronary circulation: Secondary | ICD-10-CM | POA: Diagnosis not present

## 2022-08-09 DIAGNOSIS — Z720 Tobacco use: Secondary | ICD-10-CM | POA: Diagnosis present

## 2022-08-09 DIAGNOSIS — R079 Chest pain, unspecified: Secondary | ICD-10-CM

## 2022-08-09 DIAGNOSIS — I10 Essential (primary) hypertension: Secondary | ICD-10-CM

## 2022-08-09 DIAGNOSIS — I2510 Atherosclerotic heart disease of native coronary artery without angina pectoris: Secondary | ICD-10-CM

## 2022-08-09 MED ORDER — IOHEXOL 350 MG/ML SOLN
100.0000 mL | Freq: Once | INTRAVENOUS | Status: AC | PRN
  Administered 2022-08-09: 100 mL via INTRAVENOUS

## 2022-08-09 MED ORDER — NITROGLYCERIN 0.4 MG SL SUBL
SUBLINGUAL_TABLET | SUBLINGUAL | Status: AC
  Filled 2022-08-09: qty 2

## 2022-08-09 MED ORDER — NITROGLYCERIN 0.4 MG SL SUBL
0.8000 mg | SUBLINGUAL_TABLET | Freq: Once | SUBLINGUAL | Status: AC
  Administered 2022-08-09: 0.8 mg via SUBLINGUAL

## 2022-08-09 NOTE — Progress Notes (Unsigned)
CT FFR ordered.   Gerri Spore T. Flora Lipps, MD, [facility]  [facility]  [address] [place], [place] [address] [phone]  4:55 PM

## 2022-08-10 ENCOUNTER — Ambulatory Visit (HOSPITAL_COMMUNITY)
Admission: RE | Admit: 2022-08-10 | Discharge: 2022-08-10 | Disposition: A | Discharge status: Home / Self Care | Payer: BC Managed Care – PPO | Source: Ambulatory Visit | Attending: Cardiovascular Disease | Admitting: Cardiovascular Disease

## 2022-08-10 DIAGNOSIS — R931 Abnormal findings on diagnostic imaging of heart and coronary circulation: Secondary | ICD-10-CM | POA: Diagnosis present

## 2022-08-13 ENCOUNTER — Telehealth: Payer: Self-pay | Admitting: Cardiology

## 2022-08-13 NOTE — Telephone Encounter (Signed)
This study demonstrates:  CT did show multiple pulmonary nodules. Repeat CT noncontrast recommended at 3-6 months.   Medication changes / Follow up studies / Other recommendations:     Please send results to the PCP:  Burton Apley, MD Peter [place], MD 08/12/2022 10:53 AM   Peter M [place], MD 08/11/2022  7:40 AM EST     This study demonstrates:  Coronary CTA shows diffuse nonobstructive CAD. FFR analysis negative. Medication changes / Follow up studies / Other recommendations:   This would indicated the symptoms he is having are not cardiac related. He does need aggressive risk factor modification including cessation of all tobacco products/vaping. Still waiting to review labs from Dr Su Hilt concerning his cholesterol. Goal LDL < 70.     Would have him follow up with me or APP in a couple of months.   Please send results to the PCP:  Burton Apley, MD Peter [place], MD 08/11/2022 7:35 AM     The above information given to wife  She verbalized understanding

## 2022-08-13 NOTE — Telephone Encounter (Signed)
Wife was returning call. Please advise ?

## 2022-08-14 ENCOUNTER — Other Ambulatory Visit: Payer: Self-pay

## 2022-08-14 DIAGNOSIS — R918 Other nonspecific abnormal finding of lung field: Secondary | ICD-10-CM

## 2022-08-27 NOTE — Telephone Encounter (Signed)
Called patient left message on personal voice mail one of our schedulers will be calling back this week to schedule a chest ct without contrast to be done 10/2022 and a follow up appointment with Dr.Jordan.

## 2022-09-02 ENCOUNTER — Other Ambulatory Visit (HOSPITAL_COMMUNITY): Payer: Self-pay

## 2022-09-02 MED ORDER — METHADONE HCL 10 MG PO TABS
70.0000 mg | ORAL_TABLET | Freq: Every day | ORAL | 0 refills | Status: DC
  Filled 2022-09-02: qty 196, 28d supply, fill #0

## 2022-09-09 ENCOUNTER — Telehealth: Payer: Self-pay

## 2022-09-09 NOTE — Telephone Encounter (Signed)
Called patient left message on personal voice mail Dr.Jordan reviewed labs from [place] office.Advised to call me back for Dr.Jordan's recommendation.

## 2022-09-30 ENCOUNTER — Other Ambulatory Visit (HOSPITAL_COMMUNITY): Payer: Self-pay

## 2022-09-30 MED ORDER — METHADONE HCL 10 MG PO TABS
70.0000 mg | ORAL_TABLET | Freq: Every morning | ORAL | 0 refills | Status: AC
  Filled 2022-09-30: qty 196, 18d supply, fill #0
  Filled 2022-09-30: qty 196, 28d supply, fill #0

## 2022-10-25 NOTE — Progress Notes (Deleted)
Cardiology Office Note:    Date:  07/25/2022   ID:  FREMAN LEONOR, DOB [birth year], MRN [id number]  PCP:  Lorene Dy, MD   [facility] Providers Cardiologist:  None     Referring MD: Lorene Dy, MD   Chief Complaint  Patient presents with   New Patient (Initial Visit)   Chest Pain    History of Present Illness:    Roy Espinoza is a 62 y.o. male seen at the request of Dr Mancel Bale for evaluation of chest pain. He has a history of HTN, HLD and tobacco use. He reports over the past year he has a strange sensation in his chest. Typically occurs in the early morning. Not associated with activity. Feels a pressure building in his lower chest and it becomes quite uncomfortable. Will last 2-3 minutes. He gets relief by crouching on the floor bent over and taking deep breaths. May happen 2-3 times a day then goes away. Does not bother him at other times of day. Heart beat is constant with this. Feels like a bubble blowing up. He quit smoking 10 years ago but continue to Vape daily. Reports good control of cholesterol and HTN. ASA intolerance noted as a teenager- would have a rash if he took.   We performed coronary CTA. This showed a calcium score of 780. There was moderate nonobstructive CAD esp. In the mid LAD and proximal RCA. FFR was OK. There was evidence of emphysema and multiple pulmonary nodules.   Past Medical History:  Diagnosis Date   Hyperlipidemia    Hypertension    Thyroid disease     History reviewed. No pertinent surgical history.  Current Medications: Current Meds  Medication Sig   Cholecalciferol (VITAMIN D-3) 1000 units CAPS Take 1 capsule by mouth daily.   Coenzyme Q10 (COQ10) 100 MG CAPS Take 1 capsule by mouth daily.   Cyanocobalamin (B-12) 5000 MCG CAPS Take 1 capsule by mouth daily.   DOXYLAMINE SUCCINATE, SLEEP, PO Take 25 mg by mouth at bedtime as needed. OTC sleep aid   hydrochlorothiazide (MICROZIDE) 12.5 MG capsule Take 1 capsule  (12.5 mg total) by mouth daily for blood pressure   levothyroxine (SYNTHROID) 88 MCG tablet Take 1 tablet (88 mcg total) by mouth daily for thyroid   losartan (COZAAR) 100 MG tablet Take 1 tablet (100 mg total) by mouth daily.   methadone (DOLOPHINE) 10 MG tablet Take 3 tablets (30 mg total) by mouth in the morning AND 4 tablets (40 mg total) every evening.   methylphenidate (RITALIN) 10 MG tablet Take 2 tablets (20 mg total) by mouth in the morning.   metoprolol tartrate (LOPRESSOR) 100 MG tablet Take 1 tablet (100 mg total) by mouth 2 hours before Coronary CT.   multivitamin (ONE-A-DAY MEN'S) TABS tablet Take 1 tablet by mouth daily.   Omega-3 Fatty Acids (OMEGA-3 FISH OIL PO) Take 750 mg by mouth daily.   traZODone (DESYREL) 50 MG tablet Take 2 tablets (100 mg total) by mouth at bedtime.   [DISCONTINUED] rosuvastatin (CRESTOR) 10 MG tablet Take 1 tablet (10 mg total) by mouth daily.     Allergies:   Aspirin   Social History   Socioeconomic History   Marital status: Married    Spouse name: Not on file   Number of children: 0   Years of education: Not on file   Highest education level: Not on file  Occupational History   Not on file  Tobacco Use   Smoking status:  Former    Types: Cigarettes    Quit date: 07/09/2012    Years since quitting: 10.0   Smokeless tobacco: Never  Vaping Use   Vaping Use: Every day  Substance and Sexual Activity   Alcohol use: Not on file   Drug use: Not on file   Sexual activity: Not on file  Other Topics Concern   Not on file  Social History Narrative   Sales rep    Social Determinants of Health   Financial Resource Strain: Not on file  Food Insecurity: Not on file  Transportation Needs: Not on file  Physical Activity: Not on file  Stress: Not on file  Social Connections: Not on file     Family History: The patient's family history includes Arrhythmia in his sister; Colon cancer in his mother.  ROS:   Please see the history of present  illness.     All other systems reviewed and are negative.  EKGs/Labs/Other Studies Reviewed:    The following studies were reviewed today: OVER-READ INTERPRETATION  PET-CT CHEST   The following report is an over-read performed by radiologist Dr. Rosine Beat [facility] on 08/12/2022. This over-read does not include interpretation of cardiac or coronary anatomy or pathology. The cardiac CT interpretation by the cardiologist is to be attached.   COMPARISON:  None.   FINDINGS: No evidence for lymphadenopathy within the visualized mediastinum or hilar regions.   Centrilobular emphsyema noted. Multiple pulmonary nodules are identified in the right middle lobe and left lower lobe ranging in size from 3 mm up to 6 mm. No evidence for pleural effusion. There is some compressive atelectasis in the dependent lung bases bilaterally.   Visualized portions of the upper abdomen are unremarkable.   No suspicious lytic or sclerotic osseous abnormality.   IMPRESSION: 1. Multiple pulmonary nodules ranging in size from 3 mm up to 6 mm. Non-contrast chest CT at 3-6 months is recommended. If the nodules are stable at time of repeat CT, then future CT at 18-24 months (from today's scan) is considered optional for low-risk patients, but is recommended for high-risk patients. This recommendation follows the consensus statement: Guidelines for Management of Incidental Pulmonary Nodules Detected on CT Images: From the Fleischner Society 2017; Radiology 2017; 284:228-243. 2. Centrilobular emphysema.   These results will be called to the ordering clinician or representative by the Radiologist Assistant, and communication documented in the PACS or [employer].     Electronically Signed   By: Misty Stanley M.D.   On: 08/12/2022 10:20  Cardiac/Coronary CTA   TECHNIQUE: A non-contrast, gated CT scan was obtained with axial slices of 3 mm through the heart for calcium  scoring. Calcium scoring was performed using the Agatston method. A 70 kV prospective, gated, contrast cardiac scan was obtained. Gantry rotation speed was 250 msecs and collimation was 0.6 mm. Two sublingual nitroglycerin tablets (0.8 mg) were given. The 3D data set was reconstructed in 5% intervals of the 35-75% of the R-R cycle. Diastolic phases were analyzed on a dedicated workstation using MPR, MIP, and VRT modes. The patient received 95 cc of contrast.   FINDINGS: Image quality: Excellent.   Noise artifact is: Limited.   Coronary Arteries:  Normal coronary origin.  Right dominance.   Left main: The left main is a large caliber vessel with a normal take off from the left coronary cusp that bifurcates to form a left anterior descending artery and a left circumflex artery. There is minimal calcified plaque (<25%).  Left anterior descending artery: The proximal LAD is patent. The mid LAD contains mild to moderate mixed density plaque (40-50%). The distal LAD is patent. D1 contains minimal calcified plaque (<25%). D2 is patent.   Left circumflex artery: The LCX is non-dominant. There is mild calcified plaque (25-49%). OM1 contains minimal mixed density plaque (<25%). OM2 contains mild calcified plaque (25-49%).   Right coronary artery: The RCA is dominant with normal take off from the right coronary cusp. There is mild to moderate plaque (40-50%) in the proximal segment. The mid and distal segments are patent. The RCA terminates as a PDA and right posterolateral branch without evidence of plaque or stenosis.   Right Atrium: Right atrial size is within normal limits.   Right Ventricle: The right ventricular cavity is within normal limits.   Left Atrium: Left atrial size is normal in size with no left atrial appendage filling defect. Small PFO.   Left Ventricle: The ventricular cavity size is within normal limits. Evidence of a small muscular VSD in the mid  inferoseptum.   Pulmonary arteries: Normal in size without proximal filling defect.   Pulmonary veins: Normal pulmonary venous drainage.   Pericardium: Normal thickness without significant effusion or calcium present.   Cardiac valves: The aortic valve is trileaflet without significant calcification. The mitral valve is normal without significant calcification.   Aorta: Normal caliber without significant disease.   Extra-cardiac findings: See attached radiology report for non-cardiac structures.   IMPRESSION: 1. Coronary calcium score of 780. This was 94th percentile for age-, sex, and race-matched controls.   2. Normal coronary origin with right dominance.   3. Mild to moderate mixed density plaque in the mid LAD (40-50%).   4. Mild calcified plaque in the LCX (25-49%).   5. Mild to moderate plaque in the proximal RCA (40-50%).   6. Small PFO.   7. Evidence of a small muscular VSD in the mid inferoseptum.   RECOMMENDATIONS: 1. Moderate stenosis. Consider symptom-guided anti-ischemic pharmacotherapy as well as risk factor modification per guideline directed care. Additional analysis with CT FFR will be submitted.   Eleonore Chiquito, MD   Electronically Signed: By: Eleonore Chiquito M.D. On: 08/09/2022 16:54  EKG:  EKG is  ordered today.  The ekg ordered today demonstrates NSR rate 73. Incomplete RBBB. Normal. I have personally reviewed and interpreted this study.   Recent Labs: No results found for requested labs within last 365 days.  Recent Lipid Panel No results found for: "CHOL", "TRIG", "HDL", "CHOLHDL", "VLDL", "LDLCALC", "LDLDIRECT"   Risk Assessment/Calculations:                Physical Exam:    VS:  BP 116/70 (BP Location: Right Arm, Patient Position: Sitting, Cuff Size: Normal)   Pulse 73   Ht '5\' 8"'$  (1.727 m)   Wt 142 lb (64.4 kg)   BMI 21.59 kg/m     Wt Readings from Last 3 Encounters:  07/25/22 142 lb (64.4 kg)  05/24/22 138 lb 4.8 oz  (62.7 kg)  07/10/16 142 lb 14.4 oz (64.8 kg)     GEN:  Well nourished, well developed in no acute distress HEENT: Normal NECK: No JVD; No carotid bruits LYMPHATICS: No lymphadenopathy CARDIAC: RRR, no murmurs, rubs, gallops RESPIRATORY:  Clear to auscultation without rales, wheezing or rhonchi  ABDOMEN: Soft, non-tender, non-distended MUSCULOSKELETAL:  No edema; No deformity  SKIN: Warm and dry NEUROLOGIC:  Alert and oriented x 3 PSYCHIATRIC:  Normal affect   ASSESSMENT:  1. Chest pain of uncertain etiology   2. Primary hypertension   3. Hypercholesterolemia   4. Tobacco abuse   5. Precordial pain    PLAN:    In order of problems listed above:  Chest pain with atypical features. Patient has CV risk factors of HTN, HLD and tobacco use. Recommend ischemic evaluation. Coronary CTA with moderate nonobstructive CAD. Need to focus on risk factor modification.  HTN controlled. HLD on Crestor every other day. Request copy of labs Tobacco use/vaping. Counseled on importance of cessation to lower CV risk.  Emphysema Pulmonary nodules- fu CT           Medication Adjustments/Labs and Tests Ordered: Current medicines are reviewed at length with the patient today.  Concerns regarding medicines are outlined above.  Orders Placed This Encounter  Procedures   CT CORONARY MORPH W/CTA COR W/SCORE W/CA W/CM &/OR WO/CM   Basic metabolic panel   EKG [id number]   Meds ordered this encounter  Medications   rosuvastatin (CRESTOR) 10 MG tablet    Sig: Take 1 tablet (10 mg total) by mouth daily. Takes 3-4 days a week.    Dispense:  90 tablet    Refill:  4   metoprolol tartrate (LOPRESSOR) 100 MG tablet    Sig: Take 1 tablet (100 mg total) by mouth 2 hours before Coronary CT.    Dispense:  1 tablet    Refill:  0      Signed, Glennice Marcos [place], MD  07/25/2022 2:53 PM    [place]

## 2022-10-28 ENCOUNTER — Other Ambulatory Visit (HOSPITAL_COMMUNITY): Payer: Self-pay

## 2022-10-28 MED ORDER — METHYLPHENIDATE HCL 10 MG PO TABS
20.0000 mg | ORAL_TABLET | Freq: Every morning | ORAL | 0 refills | Status: DC
  Filled 2022-10-28: qty 56, 28d supply, fill #0

## 2022-10-28 MED ORDER — METHADONE HCL 10 MG PO TABS
40.0000 mg | ORAL_TABLET | Freq: Two times a day (BID) | ORAL | 0 refills | Status: DC
  Filled 2022-10-28: qty 224, 28d supply, fill #0

## 2022-10-29 ENCOUNTER — Inpatient Hospital Stay: Admission: RE | Admit: 2022-10-29 | Payer: BC Managed Care – PPO | Source: Ambulatory Visit

## 2022-11-04 ENCOUNTER — Ambulatory Visit: Payer: BC Managed Care – PPO | Attending: Cardiology | Admitting: Cardiology

## 2022-11-08 ENCOUNTER — Telehealth: Payer: Self-pay

## 2022-11-08 NOTE — Telephone Encounter (Signed)
Called patient left message on personal voice mail I noticed you missed your follow up appointment with Dr.Jordan 11/04/22.Advised to call back to reschedule.

## 2022-11-25 ENCOUNTER — Other Ambulatory Visit (HOSPITAL_COMMUNITY): Payer: Self-pay

## 2022-11-25 MED ORDER — METHADONE HCL 10 MG PO TABS
ORAL_TABLET | ORAL | 0 refills | Status: DC
  Filled 2022-11-25: qty 24, 3d supply, fill #0
  Filled 2022-11-25: qty 200, 25d supply, fill #0

## 2022-11-26 ENCOUNTER — Other Ambulatory Visit: Payer: Self-pay

## 2022-11-27 ENCOUNTER — Other Ambulatory Visit (HOSPITAL_COMMUNITY): Payer: Self-pay

## 2022-12-23 ENCOUNTER — Other Ambulatory Visit (HOSPITAL_COMMUNITY): Payer: Self-pay

## 2022-12-23 MED ORDER — HYDROCHLOROTHIAZIDE 12.5 MG PO CAPS
12.5000 mg | ORAL_CAPSULE | Freq: Every day | ORAL | 1 refills | Status: AC
  Filled 2022-12-23: qty 90, 90d supply, fill #0
  Filled 2023-05-12: qty 90, 90d supply, fill #1

## 2022-12-23 MED ORDER — METHADONE HCL 10 MG PO TABS
ORAL_TABLET | ORAL | 0 refills | Status: DC
  Filled 2022-12-23: qty 224, 28d supply, fill #0

## 2022-12-23 MED ORDER — METHYLPHENIDATE HCL 10 MG PO TABS
20.0000 mg | ORAL_TABLET | Freq: Every morning | ORAL | 0 refills | Status: DC
  Filled 2022-12-23: qty 56, 28d supply, fill #0

## 2023-01-21 ENCOUNTER — Other Ambulatory Visit (HOSPITAL_COMMUNITY): Payer: Self-pay

## 2023-01-21 MED ORDER — METHYLPHENIDATE HCL 10 MG PO TABS
20.0000 mg | ORAL_TABLET | Freq: Every morning | ORAL | 0 refills | Status: AC
  Filled 2023-01-21: qty 56, 28d supply, fill #0

## 2023-01-21 MED ORDER — METHADONE HCL 10 MG PO TABS
40.0000 mg | ORAL_TABLET | Freq: Two times a day (BID) | ORAL | 0 refills | Status: DC
  Filled 2023-01-21: qty 224, 28d supply, fill #0

## 2023-02-12 ENCOUNTER — Other Ambulatory Visit (HOSPITAL_COMMUNITY): Payer: Self-pay

## 2023-02-17 ENCOUNTER — Other Ambulatory Visit (HOSPITAL_COMMUNITY): Payer: Self-pay

## 2023-02-17 MED ORDER — METHADONE HCL 10 MG PO TABS
ORAL_TABLET | ORAL | 0 refills | Status: DC
  Filled 2023-02-17: qty 224, 28d supply, fill #0

## 2023-02-24 ENCOUNTER — Other Ambulatory Visit (HOSPITAL_COMMUNITY): Payer: Self-pay

## 2023-03-17 ENCOUNTER — Other Ambulatory Visit (HOSPITAL_COMMUNITY): Payer: Self-pay

## 2023-03-17 MED ORDER — METHADONE HCL 10 MG PO TABS
40.0000 mg | ORAL_TABLET | Freq: Two times a day (BID) | ORAL | 0 refills | Status: DC
  Filled 2023-03-17: qty 224, 28d supply, fill #0

## 2023-04-14 ENCOUNTER — Other Ambulatory Visit (HOSPITAL_COMMUNITY): Payer: Self-pay

## 2023-04-14 MED ORDER — METHADONE HCL 10 MG PO TABS
40.0000 mg | ORAL_TABLET | Freq: Two times a day (BID) | ORAL | 0 refills | Status: DC
  Filled 2023-04-14: qty 224, 28d supply, fill #0

## 2023-05-12 ENCOUNTER — Other Ambulatory Visit (HOSPITAL_COMMUNITY): Payer: Self-pay

## 2023-05-12 MED ORDER — HYDROCHLOROTHIAZIDE 12.5 MG PO CAPS
12.5000 mg | ORAL_CAPSULE | Freq: Every day | ORAL | 4 refills | Status: AC
  Filled 2023-09-01: qty 90, 90d supply, fill #0
  Filled 2023-12-22: qty 90, 90d supply, fill #1
  Filled 2024-04-27: qty 90, 90d supply, fill #2

## 2023-05-12 MED ORDER — LOSARTAN POTASSIUM 100 MG PO TABS
100.0000 mg | ORAL_TABLET | Freq: Every day | ORAL | 4 refills | Status: DC
  Filled 2023-05-12: qty 90, 90d supply, fill #0
  Filled 2023-09-01: qty 90, 90d supply, fill #1
  Filled 2023-12-22: qty 90, 90d supply, fill #2
  Filled 2024-04-27: qty 90, 90d supply, fill #3

## 2023-05-12 MED ORDER — TRAZODONE HCL 50 MG PO TABS
100.0000 mg | ORAL_TABLET | Freq: Every evening | ORAL | 4 refills | Status: DC
  Filled 2023-05-12: qty 180, 90d supply, fill #0
  Filled 2023-09-01: qty 180, 90d supply, fill #1
  Filled 2023-12-22: qty 180, 90d supply, fill #2
  Filled 2024-04-12: qty 180, 90d supply, fill #3

## 2023-05-12 MED ORDER — LEVOTHYROXINE SODIUM 88 MCG PO TABS
88.0000 ug | ORAL_TABLET | Freq: Every day | ORAL | 4 refills | Status: DC
  Filled 2023-05-12: qty 90, 90d supply, fill #0
  Filled 2023-09-01: qty 90, 90d supply, fill #1
  Filled 2024-02-16: qty 90, 90d supply, fill #2

## 2023-05-12 MED ORDER — ROSUVASTATIN CALCIUM 10 MG PO TABS
10.0000 mg | ORAL_TABLET | Freq: Every day | ORAL | 4 refills | Status: AC
  Filled 2023-09-01: qty 90, 90d supply, fill #0
  Filled 2024-01-20: qty 90, 90d supply, fill #1

## 2023-05-12 MED ORDER — METHADONE HCL 10 MG PO TABS
ORAL_TABLET | ORAL | 0 refills | Status: DC
  Filled 2023-05-12: qty 78, 10d supply, fill #0
  Filled 2023-05-12: qty 146, 18d supply, fill #0

## 2023-05-13 ENCOUNTER — Other Ambulatory Visit (HOSPITAL_COMMUNITY): Payer: Self-pay

## 2023-05-13 ENCOUNTER — Other Ambulatory Visit: Payer: Self-pay

## 2023-05-27 ENCOUNTER — Other Ambulatory Visit (HOSPITAL_COMMUNITY): Payer: Self-pay

## 2023-06-10 ENCOUNTER — Other Ambulatory Visit (HOSPITAL_COMMUNITY): Payer: Self-pay

## 2023-06-10 MED ORDER — METHADONE HCL 10 MG PO TABS
40.0000 mg | ORAL_TABLET | Freq: Two times a day (BID) | ORAL | 0 refills | Status: DC
  Filled 2023-06-10: qty 224, 28d supply, fill #0
  Filled 2023-06-11: qty 120, 15d supply, fill #0
  Filled 2023-06-11: qty 104, 13d supply, fill #0
  Filled 2023-06-11: qty 224, 28d supply, fill #0

## 2023-06-11 ENCOUNTER — Other Ambulatory Visit (HOSPITAL_COMMUNITY): Payer: Self-pay

## 2023-06-11 ENCOUNTER — Other Ambulatory Visit: Payer: Self-pay

## 2023-07-07 ENCOUNTER — Other Ambulatory Visit (HOSPITAL_COMMUNITY): Payer: Self-pay

## 2023-07-07 MED ORDER — METHADONE HCL 10 MG PO TABS
40.0000 mg | ORAL_TABLET | Freq: Two times a day (BID) | ORAL | 0 refills | Status: DC
  Filled 2023-07-07: qty 224, 28d supply, fill #0

## 2023-08-05 ENCOUNTER — Other Ambulatory Visit (HOSPITAL_COMMUNITY): Payer: Self-pay

## 2023-08-05 MED ORDER — METHADONE HCL 10 MG PO TABS
40.0000 mg | ORAL_TABLET | Freq: Two times a day (BID) | ORAL | 0 refills | Status: DC
  Filled 2023-08-05: qty 224, 28d supply, fill #0

## 2023-09-01 ENCOUNTER — Other Ambulatory Visit (HOSPITAL_COMMUNITY): Payer: Self-pay

## 2023-09-01 MED ORDER — CLONIDINE HCL 0.1 MG PO TABS
0.1000 mg | ORAL_TABLET | Freq: Every day | ORAL | 11 refills | Status: AC
  Filled 2023-09-01: qty 30, 30d supply, fill #0
  Filled 2023-12-22: qty 30, 30d supply, fill #1

## 2023-09-01 MED ORDER — METHADONE HCL 10 MG PO TABS
40.0000 mg | ORAL_TABLET | Freq: Two times a day (BID) | ORAL | 0 refills | Status: DC
  Filled 2023-09-01: qty 224, 28d supply, fill #0

## 2023-09-02 ENCOUNTER — Other Ambulatory Visit (HOSPITAL_COMMUNITY): Payer: Self-pay

## 2023-09-29 ENCOUNTER — Other Ambulatory Visit (HOSPITAL_COMMUNITY): Payer: Self-pay

## 2023-09-29 MED ORDER — METHADONE HCL 10 MG PO TABS
40.0000 mg | ORAL_TABLET | Freq: Two times a day (BID) | ORAL | 0 refills | Status: DC
  Filled 2023-09-29: qty 224, 28d supply, fill #0

## 2023-09-30 ENCOUNTER — Other Ambulatory Visit (HOSPITAL_COMMUNITY): Payer: Self-pay

## 2023-10-27 ENCOUNTER — Other Ambulatory Visit (HOSPITAL_COMMUNITY): Payer: Self-pay

## 2023-10-27 MED ORDER — METHADONE HCL 10 MG PO TABS
80.0000 mg | ORAL_TABLET | Freq: Every day | ORAL | 0 refills | Status: DC
  Filled 2023-10-27: qty 214, 26d supply, fill #0
  Filled 2023-10-28: qty 10, 2d supply, fill #0

## 2023-10-28 ENCOUNTER — Other Ambulatory Visit (HOSPITAL_COMMUNITY): Payer: Self-pay

## 2023-11-24 ENCOUNTER — Other Ambulatory Visit (HOSPITAL_COMMUNITY): Payer: Self-pay

## 2023-11-24 MED ORDER — METHADONE HCL 10 MG PO TABS
40.0000 mg | ORAL_TABLET | Freq: Two times a day (BID) | ORAL | 0 refills | Status: DC
  Filled 2023-11-24: qty 190, 24d supply, fill #0
  Filled 2023-11-24: qty 34, 4d supply, fill #0

## 2023-12-19 ENCOUNTER — Other Ambulatory Visit (HOSPITAL_COMMUNITY): Payer: Self-pay

## 2023-12-22 ENCOUNTER — Other Ambulatory Visit (HOSPITAL_COMMUNITY): Payer: Self-pay

## 2023-12-22 MED ORDER — METHADONE HCL 10 MG PO TABS
40.0000 mg | ORAL_TABLET | Freq: Two times a day (BID) | ORAL | 0 refills | Status: DC
  Filled 2023-12-22: qty 224, 28d supply, fill #0

## 2023-12-23 ENCOUNTER — Other Ambulatory Visit: Payer: Self-pay

## 2024-01-20 ENCOUNTER — Other Ambulatory Visit (HOSPITAL_COMMUNITY): Payer: Self-pay

## 2024-01-20 MED ORDER — METHADONE HCL 10 MG PO TABS
40.0000 mg | ORAL_TABLET | Freq: Two times a day (BID) | ORAL | 0 refills | Status: DC
  Filled 2024-01-20: qty 24, 3d supply, fill #0
  Filled 2024-01-20: qty 200, 25d supply, fill #0

## 2024-02-09 ENCOUNTER — Other Ambulatory Visit (HOSPITAL_COMMUNITY): Payer: Self-pay

## 2024-02-16 ENCOUNTER — Other Ambulatory Visit (HOSPITAL_COMMUNITY): Payer: Self-pay

## 2024-02-16 MED ORDER — METHADONE HCL 10 MG PO TABS
40.0000 mg | ORAL_TABLET | Freq: Two times a day (BID) | ORAL | 0 refills | Status: DC
  Filled 2024-02-16: qty 224, 28d supply, fill #0

## 2024-03-15 ENCOUNTER — Other Ambulatory Visit (HOSPITAL_COMMUNITY): Payer: Self-pay

## 2024-03-15 MED ORDER — METHADONE HCL 10 MG PO TABS
40.0000 mg | ORAL_TABLET | Freq: Two times a day (BID) | ORAL | 0 refills | Status: DC
  Filled 2024-03-15: qty 76, 10d supply, fill #0
  Filled 2024-03-15: qty 148, 18d supply, fill #0

## 2024-04-12 ENCOUNTER — Other Ambulatory Visit (HOSPITAL_COMMUNITY): Payer: Self-pay

## 2024-04-12 MED ORDER — METHADONE HCL 10 MG PO TABS
40.0000 mg | ORAL_TABLET | Freq: Two times a day (BID) | ORAL | 0 refills | Status: DC
  Filled 2024-04-12: qty 224, 28d supply, fill #0

## 2024-04-27 ENCOUNTER — Other Ambulatory Visit (HOSPITAL_COMMUNITY): Payer: Self-pay

## 2024-05-03 ENCOUNTER — Other Ambulatory Visit (HOSPITAL_COMMUNITY): Payer: Self-pay

## 2024-05-03 MED ORDER — METHADONE HCL 10 MG PO TABS
40.0000 mg | ORAL_TABLET | Freq: Two times a day (BID) | ORAL | 0 refills | Status: DC
  Filled 2024-05-10: qty 224, 28d supply, fill #0

## 2024-05-10 ENCOUNTER — Other Ambulatory Visit (HOSPITAL_COMMUNITY): Payer: Self-pay

## 2024-05-10 MED ORDER — CLONIDINE HCL 0.1 MG PO TABS: 0.1000 mg | ORAL_TABLET | Freq: Every day | ORAL | 11 refills | Status: AC

## 2024-05-10 MED ORDER — TRAZODONE HCL 50 MG PO TABS
100.0000 mg | ORAL_TABLET | Freq: Every day | ORAL | 4 refills | Status: AC
  Filled 2024-07-06: qty 180, 90d supply, fill #0

## 2024-05-10 MED ORDER — LOSARTAN POTASSIUM 100 MG PO TABS
100.0000 mg | ORAL_TABLET | Freq: Every day | ORAL | 4 refills | Status: AC
  Filled 2024-08-02: qty 30, 30d supply, fill #0
  Filled 2024-08-30: qty 30, 30d supply, fill #1

## 2024-05-10 MED ORDER — HYDROCHLOROTHIAZIDE 12.5 MG PO TABS
12.5000 mg | ORAL_TABLET | Freq: Every day | ORAL | 4 refills | Status: AC
  Filled 2024-08-02: qty 30, 30d supply, fill #0
  Filled 2024-08-30: qty 30, 30d supply, fill #1

## 2024-05-10 MED ORDER — LEVOTHYROXINE SODIUM 88 MCG PO TABS
88.0000 ug | ORAL_TABLET | Freq: Every day | ORAL | 4 refills | Status: AC
  Filled 2024-06-08: qty 90, 90d supply, fill #0
  Filled 2024-09-27: qty 30, 30d supply, fill #1

## 2024-05-10 MED ORDER — ROSUVASTATIN CALCIUM 10 MG PO TABS
10.0000 mg | ORAL_TABLET | Freq: Every day | ORAL | 4 refills | Status: AC
  Filled 2024-05-10: qty 90, 90d supply, fill #0
  Filled 2024-08-30: qty 30, 30d supply, fill #1

## 2024-06-08 ENCOUNTER — Other Ambulatory Visit (HOSPITAL_COMMUNITY): Payer: Self-pay

## 2024-06-08 MED ORDER — METHADONE HCL 10 MG PO TABS
40.0000 mg | ORAL_TABLET | Freq: Two times a day (BID) | ORAL | 0 refills | Status: DC
  Filled 2024-06-08: qty 224, 28d supply, fill #0

## 2024-07-06 ENCOUNTER — Other Ambulatory Visit (HOSPITAL_COMMUNITY): Payer: Self-pay

## 2024-07-06 MED ORDER — METHADONE HCL 10 MG PO TABS
40.0000 mg | ORAL_TABLET | Freq: Every morning | ORAL | 0 refills | Status: DC
  Filled 2024-07-06: qty 224, 28d supply, fill #0

## 2024-07-06 MED ORDER — METHYLPREDNISOLONE 4 MG PO TABS
8.0000 mg | ORAL_TABLET | Freq: Two times a day (BID) | ORAL | 0 refills | Status: AC
  Filled 2024-07-06: qty 40, 10d supply, fill #0

## 2024-08-02 ENCOUNTER — Other Ambulatory Visit (HOSPITAL_COMMUNITY): Payer: Self-pay

## 2024-08-02 MED ORDER — METHADONE HCL 10 MG PO TABS
40.0000 mg | ORAL_TABLET | Freq: Two times a day (BID) | ORAL | 0 refills | Status: DC
  Filled 2024-08-02: qty 224, 28d supply, fill #0

## 2024-08-30 ENCOUNTER — Other Ambulatory Visit (HOSPITAL_COMMUNITY): Payer: Self-pay

## 2024-08-30 MED ORDER — METHADONE HCL 10 MG PO TABS
40.0000 mg | ORAL_TABLET | Freq: Two times a day (BID) | ORAL | 0 refills | Status: DC
  Filled 2024-08-30: qty 224, 28d supply, fill #0

## 2024-09-27 ENCOUNTER — Other Ambulatory Visit (HOSPITAL_COMMUNITY): Payer: Self-pay

## 2024-09-27 MED ORDER — METHADONE HCL 10 MG PO TABS
40.0000 mg | ORAL_TABLET | Freq: Two times a day (BID) | ORAL | 0 refills | Status: AC
  Filled 2024-09-27: qty 206, 26d supply, fill #0
  Filled 2024-09-27: qty 18, 2d supply, fill #0
